# Patient Record
Sex: Female | Born: 1980 | Race: White | Hispanic: No | Marital: Married | Smoking: Never smoker
Health system: Southern US, Community
[De-identification: ages and names within clinical notes are randomized; demographics above are authoritative.]

## PROBLEM LIST (undated history)

## (undated) ENCOUNTER — Inpatient Hospital Stay (HOSPITAL_COMMUNITY): Payer: Self-pay

## (undated) DIAGNOSIS — O139 Gestational [pregnancy-induced] hypertension without significant proteinuria, unspecified trimester: Secondary | ICD-10-CM

## (undated) DIAGNOSIS — I8392 Asymptomatic varicose veins of left lower extremity: Secondary | ICD-10-CM

## (undated) HISTORY — PX: VARICOSE VEIN SURGERY: SHX832

## (undated) HISTORY — PX: NO PAST SURGERIES: SHX2092

## (undated) HISTORY — DX: Asymptomatic varicose veins of left lower extremity: I83.92

## (undated) HISTORY — PX: WISDOM TOOTH EXTRACTION: SHX21

---

## 2011-06-03 LAB — GC/CHLAMYDIA PROBE AMP, GENITAL

## 2011-06-03 LAB — ABO/RH: RH Type: POSITIVE

## 2011-11-24 ENCOUNTER — Other Ambulatory Visit: Payer: Self-pay | Admitting: Obstetrics and Gynecology

## 2011-11-24 DIAGNOSIS — N63 Unspecified lump in unspecified breast: Secondary | ICD-10-CM

## 2011-11-27 ENCOUNTER — Other Ambulatory Visit: Payer: BC Managed Care – PPO

## 2011-12-10 ENCOUNTER — Other Ambulatory Visit: Payer: BC Managed Care – PPO

## 2011-12-16 ENCOUNTER — Inpatient Hospital Stay (HOSPITAL_COMMUNITY): Payer: BC Managed Care – PPO

## 2011-12-16 ENCOUNTER — Encounter (HOSPITAL_COMMUNITY): Payer: Self-pay | Admitting: *Deleted

## 2011-12-16 ENCOUNTER — Inpatient Hospital Stay (HOSPITAL_COMMUNITY)
Admission: AD | Admit: 2011-12-16 | Discharge: 2011-12-16 | Disposition: A | Discharge status: Home / Self Care | Payer: BC Managed Care – PPO | Source: Ambulatory Visit | Attending: Obstetrics and Gynecology | Admitting: Obstetrics and Gynecology

## 2011-12-16 DIAGNOSIS — O139 Gestational [pregnancy-induced] hypertension without significant proteinuria, unspecified trimester: Secondary | ICD-10-CM | POA: Insufficient documentation

## 2011-12-16 LAB — DIFFERENTIAL
Basophils Absolute: 0 10*3/uL (ref 0.0–0.1)
Basophils Relative: 0 % (ref 0–1)
Monocytes Absolute: 0.9 10*3/uL (ref 0.1–1.0)
Neutro Abs: 8.5 10*3/uL — ABNORMAL HIGH (ref 1.7–7.7)
Neutrophils Relative %: 69 % (ref 43–77)

## 2011-12-16 LAB — CBC
HCT: 37.2 % (ref 36.0–46.0)
MCHC: 33.6 g/dL (ref 30.0–36.0)
Platelets: 213 10*3/uL (ref 150–400)
RDW: 14 % (ref 11.5–15.5)
WBC: 12.4 10*3/uL — ABNORMAL HIGH (ref 4.0–10.5)

## 2011-12-16 LAB — COMPREHENSIVE METABOLIC PANEL
ALT: 22 U/L (ref 0–35)
Albumin: 2.4 g/dL — ABNORMAL LOW (ref 3.5–5.2)
Calcium: 9.5 mg/dL (ref 8.4–10.5)
GFR calc Af Amer: >90 mL/min (ref >90)
Glucose, Bld: 97 mg/dL (ref 70–99)
Sodium: 136 mEq/L (ref 135–145)
Total Protein: 6.3 g/dL (ref 6.0–8.3)

## 2011-12-16 LAB — URINALYSIS, ROUTINE W REFLEX MICROSCOPIC
Leukocytes, UA: NEGATIVE
Nitrite: NEGATIVE
Specific Gravity, Urine: <1.005 — ABNORMAL LOW (ref 1.005–1.030)
pH: 6 (ref 5.0–8.0)

## 2011-12-16 LAB — URIC ACID: Uric Acid, Serum: 7.1 mg/dL — ABNORMAL HIGH (ref 2.4–7.0)

## 2011-12-16 MED ORDER — LABETALOL HCL 100 MG PO TABS
200.0000 mg | ORAL_TABLET | Freq: Once | ORAL | Status: AC
  Administered 2011-12-16: 200 mg via ORAL
  Filled 2011-12-16: qty 2

## 2011-12-16 MED ORDER — LABETALOL HCL 100 MG PO TABS: 100.0000 mg | ORAL_TABLET | Freq: Two times a day (BID) | ORAL | Status: DC

## 2011-12-16 NOTE — Progress Notes (Signed)
History   30 yo G1P0 EDC 1/8 presents from the office for BBP and serial bps per Dr. Stefano Gaul, denies headache, visual spots or blurring, no abd pain,with swelling to calves and ankles only, +FM, denies srom or vag bleeding  Chief Complaint  Patient presents with  . Hypertension     OB History    Grav Para Term Preterm Abortions TAB SAB Ect Mult Living   1               No past medical history on file.  No past surgical history on file.  No family history on file.  History  Substance Use Topics  . Smoking status: Not on file  . Smokeless tobacco: Not on file  . Alcohol Use: Not on file    Allergies: No Known Allergies  Prescriptions prior to admission  Medication Sig Dispense Refill  . dextromethorphan (DELSYM) 30 MG/5ML liquid Take 60 mg by mouth as needed. For cough       . guaiFENesin (MUCINEX) 600 MG 12 hr tablet Take 1,200 mg by mouth 2 (two) times daily as needed. For cough/congestion       . ondansetron (ZOFRAN-ODT) 8 MG disintegrating tablet Take 8 mg by mouth every 8 (eight) hours as needed. For nausea/vomiting       . Prenatal Vit-Fe Fumarate-FA (PRENATAL MULTIVITAMIN) TABS Take 1 tablet by mouth daily.         O: +1 edema to lower legs, DTR +2 bilaterally, no clonus  Blood pressure 140/82, pulse 87, temperature 97 F (36.1 C), temperature source Oral, resp. rate 20, height 5\' 6"  (1.676 m), weight 96.616 kg (213 lb), SpO2 99.00%.    ED Course  A HTN 37 1/7 week IUP P BPP, serial bps, labetalol 200 mg po now. Collaboration with Dr. Pennie Rushing per telephone. Lavera Guise, CNM

## 2011-12-16 NOTE — Progress Notes (Signed)
S:  Pt still w/o any PIH s/s.  GFM.  No discernable ctxs.  Husband supportive and at Bedside.  Does feel she has pushed herself "to the limit," the past few days in preparation for getting out for Christmas vacation and starting maternity leave.  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  Has 1/2 day only tomorrow w/ students (early release day).  O:  .Marland Kitchen Filed Vitals:   12/16/11 2105 12/16/11 2206 12/16/11 2208 12/16/11 2223  BP: 140/82 141/82 127/74 122/72  Pulse: 87 86 81 85  Temp:      TempSrc:      Resp:      Height:      Weight:      SpO2:      .Marland Kitchen Results for orders placed during the hospital encounter of 12/16/11 (from the past 24 hour(s))  CBC     Status: Abnormal   Collection Time   12/16/11  9:35 PM      Component Value Range   WBC 12.4 (*) 4.0 - 10.5 (K/uL)   RBC 4.23  3.87 - 5.11 (MIL/uL)   Hemoglobin 12.5  12.0 - 15.0 (g/dL)   HCT 16.1  09.6 - 04.5 (%)   MCV 87.9  78.0 - 100.0 (fL)   MCH 29.6  26.0 - 34.0 (pg)   MCHC 33.6  30.0 - 36.0 (g/dL)   RDW 40.9  81.1 - 91.4 (%)   Platelets 213  150 - 400 (K/uL)  DIFFERENTIAL     Status: Abnormal   Collection Time   12/16/11  9:35 PM      Component Value Range   Neutrophils Relative 69  43 - 77 (%)   Neutro Abs 8.5 (*) 1.7 - 7.7 (K/uL)   Lymphocytes Relative 22  12 - 46 (%)   Lymphs Abs 2.7  0.7 - 4.0 (K/uL)   Monocytes Relative 8  3 - 12 (%)   Monocytes Absolute 0.9  0.1 - 1.0 (K/uL)   Eosinophils Relative 1  0 - 5 (%)   Eosinophils Absolute 0.2  0.0 - 0.7 (K/uL)   Basophils Relative 0  0 - 1 (%)   Basophils Absolute 0.0  0.0 - 0.1 (K/uL)  URIC ACID     Status: Abnormal   Collection Time   12/16/11  9:35 PM      Component Value Range   Uric Acid, Serum 7.1 (*) 2.4 - 7.0 (mg/dL)  LACTATE DEHYDROGENASE     Status: Normal   Collection Time   12/16/11  9:35 PM      Component Value Range   LD 184  94 - 250 (U/L)  COMPREHENSIVE METABOLIC PANEL     Status: Abnormal   Collection Time   12/16/11  9:35 PM      Component Value Range   Sodium  136  135 - 145 (mEq/L)   Potassium 3.9  3.5 - 5.1 (mEq/L)   Chloride 102  96 - 112 (mEq/L)   CO2 23  19 - 32 (mEq/L)   Glucose, Bld 97  70 - 99 (mg/dL)   BUN 10  6 - 23 (mg/dL)   Creatinine, Ser 7.82  0.50 - 1.10 (mg/dL)   Calcium 9.5  8.4 - 95.6 (mg/dL)   Total Protein 6.3  6.0 - 8.3 (g/dL)   Albumin 2.4 (*) 3.5 - 5.2 (g/dL)   AST 23  0 - 37 (U/L)   ALT 22  0 - 35 (U/L)   Alkaline Phosphatase 116  39 - 117 (U/L)  Total Bilirubin 0.2 (*) 0.3 - 1.2 (mg/dL)   GFR calc non Af Amer >90  >90 (mL/min)   GFR calc Af Amer >90  >90 (mL/min)  URINALYSIS, ROUTINE W REFLEX MICROSCOPIC     Status: Abnormal   Collection Time   12/16/11 10:00 PM      Component Value Range   Color, Urine YELLOW  YELLOW    APPearance CLEAR  CLEAR    Specific Gravity, Urine <1.005 (*) 1.005 - 1.030    pH 6.0  5.0 - 8.0    Glucose, UA NEGATIVE  NEGATIVE (mg/dL)   Hgb urine dipstick NEGATIVE  NEGATIVE    Bilirubin Urine NEGATIVE  NEGATIVE    Ketones, ur NEGATIVE  NEGATIVE (mg/dL)   Protein, ur NEGATIVE  NEGATIVE (mg/dL)   Urobilinogen, UA 0.2  0.0 - 1.0 (mg/dL)   Nitrite NEGATIVE  NEGATIVE    Leukocytes, UA NEGATIVE  NEGATIVE    PE:  Gen:  NAD, A&Ox3          Abd:  Soft, NT gravid          Pelvic:  Deferred          Ext:  DTR's 2+ BLE:  Moderate generalized edema at present--has compression hose on. No clonus  EFM:  135, reactive, moderate variability, no decels TOCO:  Few ripples  U/S:  Vtx, 8/8 BPP. AFI:  Subjectively WNL  A:  1.  IUP at 37.1       2.  Gestational HTN       3.  Recent decreased FM, but had been on cold medicine, and once stopped taking, movement returned to nml       4.  Reactive NST & 8/8 BPP--total = 10/10 fetal testing today       5.  Work stressors       6.  Normal PIH labs P:  1.  Per c/w Dr. Pennie Rushing, may d/c home to begin 24 hr urine in the morning and drop off Friday at [facility] and get BP            checked.       2.  PIH precautions and FKC    3.  Pt desires to not go into work  tomorrow, so letter stating maternity leave starting 12/16/11 given.    4.  Level 2 BR    5.  Will add growth u/s w/ BPP to next week's appt (pt thinks Thursday 12/27)    6.  F/u prn    7.  Pt received 200mg  po Labetalol x1 about 1.5 hrs ago, but when reconsulted w/ Dr. Pennie Rushing, she rec'd Labetalol 100mg  po bid more appropriate since pt will be OOW and Level 2 BR; Called Rx into [facility] on [address] to be picked up tonight.  (of note--pt lives in [place]).  Rexene Edison, CNM 12/16/11 2313

## 2011-12-16 NOTE — Progress Notes (Signed)
Pt sent from the office -for evaluation of B/P and NST

## 2011-12-18 ENCOUNTER — Ambulatory Visit
Admission: RE | Admit: 2011-12-18 | Discharge: 2011-12-18 | Disposition: A | Discharge status: Home / Self Care | Payer: BC Managed Care – PPO | Source: Ambulatory Visit | Attending: Obstetrics and Gynecology | Admitting: Obstetrics and Gynecology

## 2011-12-18 DIAGNOSIS — N63 Unspecified lump in unspecified breast: Secondary | ICD-10-CM

## 2011-12-29 NOTE — L&D Delivery Note (Signed)
Delivery Note Anesthesia: Local  Episiotomy: None Lacerations: 2nd degree;Perineal;Sulcus bilateral shallow Suture Repair: 3.0 and 4-0 monocryl, good hemostasis Est. Blood Loss (mL): 300  Mom to postpartum.  Baby to with mother.  Baraa Tubbs 01/08/2012, 5:48 AM   Delivery Note G1P0 40 3/7 week, GHTN on labetaolo 100 mg BID since 37 weeks, PPROM, lite meconium, cervidil ripening. Pushed x 5 contractions. At 4:40 AM a viable female was delivered via Vaginal, Spontaneous Delivery (Presentation: Left Occiput Anterior).  Easy delivery of the head, nuchal arm and loose nuchal cord x 1 slipped, easy delivey of the shoulders, baby placed on pt abd, cord doubly clamped et cut by FOB, APGAR: 9, 9; weight 6 lb 11 oz (3033 g).   Placenta status: Intact, Spontaneous, Schultze.  Cord: 3 vessels to pathology    [facility], [facility] 01/08/2012, 5:48 AM

## 2012-01-07 ENCOUNTER — Inpatient Hospital Stay (HOSPITAL_COMMUNITY)
Admission: AD | Admit: 2012-01-07 | Discharge: 2012-01-10 | DRG: 372 | Disposition: A | Discharge status: Home / Self Care | Payer: BC Managed Care – PPO | Source: Ambulatory Visit | Attending: Obstetrics and Gynecology | Admitting: Obstetrics and Gynecology

## 2012-01-07 ENCOUNTER — Encounter (HOSPITAL_COMMUNITY): Payer: Self-pay

## 2012-01-07 DIAGNOSIS — O2200 Varicose veins of lower extremity in pregnancy, unspecified trimester: Secondary | ICD-10-CM | POA: Diagnosis present

## 2012-01-07 DIAGNOSIS — O4290 Premature rupture of membranes, unspecified as to length of time between rupture and onset of labor, unspecified weeks of gestation: Secondary | ICD-10-CM | POA: Diagnosis present

## 2012-01-07 DIAGNOSIS — O139 Gestational [pregnancy-induced] hypertension without significant proteinuria, unspecified trimester: Principal | ICD-10-CM

## 2012-01-07 DIAGNOSIS — B951 Streptococcus, group B, as the cause of diseases classified elsewhere: Secondary | ICD-10-CM

## 2012-01-07 DIAGNOSIS — O99892 Other specified diseases and conditions complicating childbirth: Secondary | ICD-10-CM | POA: Diagnosis present

## 2012-01-07 DIAGNOSIS — Z22330 Carrier of Group B streptococcus: Secondary | ICD-10-CM

## 2012-01-07 DIAGNOSIS — O99820 Streptococcus B carrier state complicating pregnancy: Secondary | ICD-10-CM

## 2012-01-07 HISTORY — DX: Gestational (pregnancy-induced) hypertension without significant proteinuria, unspecified trimester: O13.9

## 2012-01-07 LAB — COMPREHENSIVE METABOLIC PANEL
ALT: 14 U/L (ref 0–35)
AST: 20 U/L (ref 0–37)
Albumin: 2.6 g/dL — ABNORMAL LOW (ref 3.5–5.2)
Chloride: 101 mEq/L (ref 96–112)
Creatinine, Ser: 0.56 mg/dL (ref 0.50–1.10)
Potassium: 4.4 mEq/L (ref 3.5–5.1)
Sodium: 133 mEq/L — ABNORMAL LOW (ref 135–145)
Total Bilirubin: 0.2 mg/dL — ABNORMAL LOW (ref 0.3–1.2)

## 2012-01-07 LAB — CBC
MCH: 30.3 pg (ref 26.0–34.0)
MCHC: 34 g/dL (ref 30.0–36.0)
Platelets: 216 10*3/uL (ref 150–400)

## 2012-01-07 LAB — URIC ACID: Uric Acid, Serum: 7.2 mg/dL — ABNORMAL HIGH (ref 2.4–7.0)

## 2012-01-07 MED ORDER — OXYCODONE-ACETAMINOPHEN 5-325 MG PO TABS: 2.0000 | ORAL_TABLET | ORAL | Status: DC | PRN

## 2012-01-07 MED ORDER — LACTATED RINGERS IV SOLN
INTRAVENOUS | Status: DC
  Administered 2012-01-07: 18:00:00 via INTRAVENOUS

## 2012-01-07 MED ORDER — ZOLPIDEM TARTRATE 10 MG PO TABS
10.0000 mg | ORAL_TABLET | Freq: Every evening | ORAL | Status: DC | PRN
  Administered 2012-01-07: 10 mg via ORAL
  Filled 2012-01-07: qty 1

## 2012-01-07 MED ORDER — FLEET ENEMA 7-19 GM/118ML RE ENEM: 1.0000 | ENEMA | RECTAL | Status: DC | PRN

## 2012-01-07 MED ORDER — ACETAMINOPHEN 325 MG PO TABS: 650.0000 mg | ORAL_TABLET | ORAL | Status: DC | PRN

## 2012-01-07 MED ORDER — LACTATED RINGERS IV SOLN: 500.0000 mL | INTRAVENOUS | Status: DC | PRN

## 2012-01-07 MED ORDER — IBUPROFEN 600 MG PO TABS: 600.0000 mg | ORAL_TABLET | Freq: Four times a day (QID) | ORAL | Status: DC | PRN

## 2012-01-07 MED ORDER — PROMETHAZINE HCL 25 MG/ML IJ SOLN: 12.5000 mg | INTRAMUSCULAR | Status: DC | PRN

## 2012-01-07 MED ORDER — ONDANSETRON HCL 4 MG/2ML IJ SOLN: 4.0000 mg | Freq: Four times a day (QID) | INTRAMUSCULAR | Status: DC | PRN

## 2012-01-07 MED ORDER — TERBUTALINE SULFATE 1 MG/ML IJ SOLN: 0.2500 mg | Freq: Once | INTRAMUSCULAR | Status: AC | PRN

## 2012-01-07 MED ORDER — CITRIC ACID-SODIUM CITRATE 334-500 MG/5ML PO SOLN: 30.0000 mL | ORAL | Status: DC | PRN

## 2012-01-07 MED ORDER — PENICILLIN G POTASSIUM 5000000 UNITS IJ SOLR
2.5000 10*6.[IU] | INTRAVENOUS | Status: DC
  Administered 2012-01-07 – 2012-01-08 (×2): 2.5 10*6.[IU] via INTRAVENOUS
  Filled 2012-01-07 (×5): qty 2.5

## 2012-01-07 MED ORDER — BUTORPHANOL TARTRATE 2 MG/ML IJ SOLN: 1.0000 mg | INTRAMUSCULAR | Status: DC | PRN

## 2012-01-07 MED ORDER — DINOPROSTONE 10 MG VA INST
10.0000 mg | VAGINAL_INSERT | Freq: Once | VAGINAL | Status: AC
  Administered 2012-01-07: 10 mg via VAGINAL
  Filled 2012-01-07: qty 1

## 2012-01-07 MED ORDER — LABETALOL HCL 100 MG PO TABS
100.0000 mg | ORAL_TABLET | Freq: Two times a day (BID) | ORAL | Status: DC
Start: 2012-01-07 — End: 2012-01-10
  Administered 2012-01-07 – 2012-01-10 (×6): 100 mg via ORAL
  Filled 2012-01-07 (×8): qty 1

## 2012-01-07 MED ORDER — OXYTOCIN 20 UNITS IN LACTATED RINGERS INFUSION - SIMPLE: 1.0000 m[IU]/min | INTRAVENOUS | Status: DC

## 2012-01-07 MED ORDER — OXYTOCIN BOLUS FROM INFUSION
500.0000 mL | Freq: Once | INTRAVENOUS | Status: DC
  Filled 2012-01-07: qty 1000
  Filled 2012-01-07: qty 500

## 2012-01-07 MED ORDER — LIDOCAINE HCL (PF) 1 % IJ SOLN
30.0000 mL | INTRAMUSCULAR | Status: DC | PRN
  Administered 2012-01-08: 30 mL via SUBCUTANEOUS
  Filled 2012-01-07: qty 30

## 2012-01-07 MED ORDER — OXYTOCIN 20 UNITS IN LACTATED RINGERS INFUSION - SIMPLE
125.0000 mL/h | Freq: Once | INTRAVENOUS | Status: AC
  Administered 2012-01-08: 999 mL/h via INTRAVENOUS

## 2012-01-07 MED ORDER — PENICILLIN G POTASSIUM 5000000 UNITS IJ SOLR
5.0000 10*6.[IU] | Freq: Once | INTRAVENOUS | Status: AC
  Administered 2012-01-07: 5 10*6.[IU] via INTRAVENOUS
  Filled 2012-01-07: qty 5

## 2012-01-07 MED ORDER — BUTORPHANOL TARTRATE 2 MG/ML IJ SOLN
1.0000 mg | INTRAMUSCULAR | Status: DC | PRN
  Administered 2012-01-08: 1 mg via INTRAVENOUS
  Filled 2012-01-07: qty 1

## 2012-01-07 NOTE — H&P (Signed)
Diamond Dunn is a 31 y.o. female, G1P0, 40 2/7 weeks, presenting with SROM at 3:15pm, light MSF, occasional contractions.  Cervix was 1 cm, 75% earlier this week.  Reports +FM, denies bleeding.  No report of HA, visual symptoms, or epigastric pain.  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  EDC of 01/05/12 was established by 1st trimester [place] at 5 weeks.  Anatomy scan was done at 19  weeks, with normal findings and an anterior placenta.  Further ultrasounds were done at 39 6/7 weeks, with normal fluid and BPP 8/8.  At 37 weeks, she had elevated BPs and was evaluated with PIH labs and a 24 hour urine that were normal (24 hour urine 77).  She was started on Labetalol 100 mg po BID at that time, with stabilization of BP.  Further signficant events:  [facility] in [facility]--had several small cysts on [place].  Left leg varicosities have been stable, with patient wearing compression hose for comfort.   OB History    Grav Para Term Preterm Abortions TAB SAB Ect Mult Living   1 0 0 0 0 0 0 0 0 0      Past Medical History  Diagnosis Date  . Pregnancy induced hypertension     on labetalol since 37wks  Calf injury 2009, with vein surgery as outcome.  Past Surgical History  Procedure Date  . No past surgeries   Vein surgery 2011, wisdom teeth x 2 in college  Family History: family history is negative for Anesthesia problems, and Hypotension, and Malignant hyperthermia, and Pseudochol deficiency, . Mother--heart murmur.  Father, PGF, PU hypertension and heart disease.  MGF lung ca, MGM cervical cancer.  FOB's aunt and cousin osteoporosis.  FOB's father polycythemia.  FOB aplastic anemia, hx bone marrow transplant.  Social History:  reports that she has never smoked. She does not have any  smokeless tobacco history on file. She reports that she does not drink alcohol or use illicit drugs. Caucasian, Christian faith.  College educated, employed as Runner, broadcasting/film/video.  Married to FOB, Madelaine Bhat, who is college educated and employed in Airline pilot.  Followed by the CNM service.    Dilation: 1 Effacement (%): 60;70 Station: -1;-2 Exam by:: Manfred Arch CNM Leaking light MSF.  Blood pressure 135/88, pulse 96, temperature 98.8 F (37.1 C), temperature source Oral, resp. rate 20, height 5\' 6"  (1.676 m), weight 216 lb (97.977 kg).  Chest clear Heart RRR without murmur Abd gravid, NT Pelvic--see above Ext 1+ edema, DTR 1+ without clonus Left leg with varicosities.  Prenatal labs: ABO, Rh: O/Positive/-- (06/06 0000) Antibody:  Neg    Rubella:  Immune RPR: Nonreactive (06/06 0000)  HBsAg: Negative (06/06 0000)  HIV: Non-reactive (06/06 0000)  GBS: Positive (12/12 0000)  Declined genetic screening Hgb 14.8 at NOB/12.8 at 28 weeks Glucola 115 PIH labs at 37 weeks WNL, with 24 hour urine 77.  Assessment/Plan: IUP at 40 2/7 weeks SROM, minimal labor Positive GBS Gestational hypertension, on Labetalol Varicosities of lower extremities  Plan: Admit to Birthing Suite per consult with Dr. Stefano Gaul Routine CNM orders GBS prophylaxis with PCN Continue Labetalol 100 mg po BID Will observe at present--if no increase in labor by 9pm, will recommend cervical ripening/augmentation. Patient's has goal of non-medicated labor, if possible.  Nigel Bridgeman 01/07/2012, 5:14 PM

## 2012-01-07 NOTE — Progress Notes (Signed)
States close contractions, agrees to ripening or pitocin O 145/88      fhts 130s 120 LTV mod accels      uc q 2-4 mild to mod      Vag not assessed       Results for orders placed during the hospital encounter of 01/07/12 (from the past 24 hour(s))  ANTIBODY SCREEN     Status: Normal      Component Value Range   Antibody Screen Negative    CBC     Status: Normal   Collection Time   01/07/12  5:27 PM      Component Value Range   WBC 10.4  4.0 - 10.5 (K/uL)   RBC 4.36  3.87 - 5.11 (MIL/uL)   Hemoglobin 13.2  12.0 - 15.0 (g/dL)   HCT 62.9  52.8 - 41.3 (%)   MCV 89.0  78.0 - 100.0 (fL)   MCH 30.3  26.0 - 34.0 (pg)   MCHC 34.0  30.0 - 36.0 (g/dL)   RDW 24.4  01.0 - 27.2 (%)   Platelets 216  150 - 400 (K/uL)  COMPREHENSIVE METABOLIC PANEL     Status: Abnormal   Collection Time   01/07/12  6:13 PM      Component Value Range   Sodium 133 (*) 135 - 145 (mEq/L)   Potassium 4.4  3.5 - 5.1 (mEq/L)   Chloride 101  96 - 112 (mEq/L)   CO2 24  19 - 32 (mEq/L)   Glucose, Bld 82  70 - 99 (mg/dL)   BUN 10  6 - 23 (mg/dL)   Creatinine, Ser 5.36  0.50 - 1.10 (mg/dL)   Calcium 9.5  8.4 - 64.4 (mg/dL)   Total Protein 6.7  6.0 - 8.3 (g/dL)   Albumin 2.6 (*) 3.5 - 5.2 (g/dL)   AST 20  0 - 37 (U/L)   ALT 14  0 - 35 (U/L)   Alkaline Phosphatase 129 (*) 39 - 117 (U/L)   Total Bilirubin 0.2 (*) 0.3 - 1.2 (mg/dL)   GFR calc non Af Amer >90  >90 (mL/min)   GFR calc Af Amer >90  >90 (mL/min)  LACTATE DEHYDROGENASE     Status: Normal   Collection Time   01/07/12  6:13 PM      Component Value Range   LD 186  94 - 250 (U/L)  URIC ACID     Status: Abnormal   Collection Time   01/07/12  6:13 PM      Component Value Range   Uric Acid, Serum 7.2 (*) 2.4 - 7.0 (mg/dL)   A 40 2/7 week IUP     PPROM P reassess ~ 1 hour Lavera Guise, CNM

## 2012-01-07 NOTE — Progress Notes (Signed)
Agrees to cervidil after options for cervical ripening reviewed, risks and benefits O Fhts category 1     uc q 4-5      Vag 1 0 -1 VTX R thin meconium A PPROM    40 2/7 week IUP    GHTN P cervidil placed vag posterior fornix, encourage ambien or IV meds for rest, epidural if  Desires. Collaboration with Dr. Stefano Gaul. Lavera Guise, CNM

## 2012-01-07 NOTE — Progress Notes (Signed)
  Subjective: Now on [employer].  Aware of more contractions, but not uncomfortable.  Objective: BP 153/99  Pulse 84  Temp(Src) 97.9 F (36.6 C) (Oral)  Resp 18  Ht 5\' 6"  (1.676 m)  Wt 216 lb (97.977 kg)  BMI 34.86 kg/m2     Filed Vitals:   01/07/12 1629 01/07/12 1729 01/07/12 1732  BP: 135/88 153/99 153/99  Pulse: 96 84 84  Temp: 98.8 F (37.1 C) 97.9 F (36.6 C) 97.9 F (36.6 C)  TempSrc: Oral Oral Oral  Resp: 20 20 18   Height: 5\' 6"  (1.676 m) 5\' 6"  (1.676 m)   Weight: 216 lb (97.977 kg) 216 lb (97.977 kg)      FHT:  FHR: 140-150 bpm, variability: moderate,  accelerations:  Present,  decelerations:  Absent UC:   irregular, every 2-8 minutes  Labs: Lab Results  Component Value Date   WBC 10.4 01/07/2012   HGB 13.2 01/07/2012   HCT 38.8 01/07/2012   MCV 89.0 01/07/2012   PLT 216 01/07/2012    Assessment / Plan: SROM, very early labor Gestational hypertension--due Labetalol at 7pm PIH labs pending Will continue to observe at present  Maddon Horton 01/07/2012, 6:00 PM

## 2012-01-07 NOTE — Progress Notes (Signed)
Pt reports 2 gushes of brown tinged fluid since 1510 today. States has had some "faint" contractions since then. Denies vaginal bleeding. Reports positive fetal movement.

## 2012-01-08 ENCOUNTER — Other Ambulatory Visit: Payer: Self-pay | Admitting: Obstetrics and Gynecology

## 2012-01-08 ENCOUNTER — Encounter (HOSPITAL_COMMUNITY): Payer: Self-pay | Admitting: *Deleted

## 2012-01-08 DIAGNOSIS — B951 Streptococcus, group B, as the cause of diseases classified elsewhere: Secondary | ICD-10-CM

## 2012-01-08 DIAGNOSIS — O139 Gestational [pregnancy-induced] hypertension without significant proteinuria, unspecified trimester: Secondary | ICD-10-CM

## 2012-01-08 MED ORDER — DIBUCAINE 1 % RE OINT: 1.0000 "application " | TOPICAL_OINTMENT | RECTAL | Status: DC | PRN

## 2012-01-08 MED ORDER — EPHEDRINE 5 MG/ML INJ: 10.0000 mg | INTRAVENOUS | Status: DC | PRN

## 2012-01-08 MED ORDER — FENTANYL 2.5 MCG/ML BUPIVACAINE 1/10 % EPIDURAL INFUSION (WH - ANES): 14.0000 mL/h | INTRAMUSCULAR | Status: DC

## 2012-01-08 MED ORDER — PRENATAL MULTIVITAMIN CH: 1.0000 | ORAL_TABLET | Freq: Every day | ORAL | Status: DC

## 2012-01-08 MED ORDER — ONDANSETRON HCL 4 MG/2ML IJ SOLN: 4.0000 mg | INTRAMUSCULAR | Status: DC | PRN

## 2012-01-08 MED ORDER — LACTATED RINGERS IV SOLN: 500.0000 mL | Freq: Once | INTRAVENOUS | Status: DC

## 2012-01-08 MED ORDER — PHENYLEPHRINE 40 MCG/ML (10ML) SYRINGE FOR IV PUSH (FOR BLOOD PRESSURE SUPPORT): 80.0000 ug | PREFILLED_SYRINGE | INTRAVENOUS | Status: DC | PRN

## 2012-01-08 MED ORDER — ZOLPIDEM TARTRATE 5 MG PO TABS: 5.0000 mg | ORAL_TABLET | Freq: Every evening | ORAL | Status: DC | PRN

## 2012-01-08 MED ORDER — WITCH HAZEL-GLYCERIN EX PADS: 1.0000 "application " | MEDICATED_PAD | CUTANEOUS | Status: DC | PRN

## 2012-01-08 MED ORDER — IBUPROFEN 600 MG PO TABS
600.0000 mg | ORAL_TABLET | Freq: Four times a day (QID) | ORAL | Status: DC
  Administered 2012-01-08 – 2012-01-10 (×9): 600 mg via ORAL
  Filled 2012-01-08 (×9): qty 1

## 2012-01-08 MED ORDER — BENZOCAINE-MENTHOL 20-0.5 % EX AERO
1.0000 "application " | INHALATION_SPRAY | CUTANEOUS | Status: DC | PRN
  Administered 2012-01-10: 1 via TOPICAL

## 2012-01-08 MED ORDER — PRENATAL MULTIVITAMIN CH
1.0000 | ORAL_TABLET | Freq: Every day | ORAL | Status: DC
Start: 2012-01-08 — End: 2012-01-10
  Administered 2012-01-10: 1 via ORAL
  Filled 2012-01-08 (×2): qty 1

## 2012-01-08 MED ORDER — TETANUS-DIPHTH-ACELL PERTUSSIS 5-2.5-18.5 LF-MCG/0.5 IM SUSP
0.5000 mL | Freq: Once | INTRAMUSCULAR | Status: AC
  Administered 2012-01-09: 0.5 mL via INTRAMUSCULAR
  Filled 2012-01-08: qty 0.5

## 2012-01-08 MED ORDER — DIPHENHYDRAMINE HCL 50 MG/ML IJ SOLN: 12.5000 mg | INTRAMUSCULAR | Status: DC | PRN

## 2012-01-08 MED ORDER — OXYCODONE-ACETAMINOPHEN 5-325 MG PO TABS: 1.0000 | ORAL_TABLET | ORAL | Status: DC | PRN

## 2012-01-08 MED ORDER — SIMETHICONE 80 MG PO CHEW: 80.0000 mg | CHEWABLE_TABLET | ORAL | Status: DC | PRN

## 2012-01-08 MED ORDER — DIPHENHYDRAMINE HCL 25 MG PO CAPS: 25.0000 mg | ORAL_CAPSULE | Freq: Four times a day (QID) | ORAL | Status: DC | PRN

## 2012-01-08 MED ORDER — SENNOSIDES-DOCUSATE SODIUM 8.6-50 MG PO TABS
2.0000 | ORAL_TABLET | Freq: Every day | ORAL | Status: DC
  Administered 2012-01-08 – 2012-01-09 (×2): 2 via ORAL

## 2012-01-08 MED ORDER — BENZOCAINE-MENTHOL 20-0.5 % EX AERO
INHALATION_SPRAY | CUTANEOUS | Status: AC
  Administered 2012-01-08: 10:00:00
  Filled 2012-01-08: qty 56

## 2012-01-08 MED ORDER — ONDANSETRON HCL 4 MG PO TABS: 4.0000 mg | ORAL_TABLET | ORAL | Status: DC | PRN

## 2012-01-08 MED ORDER — LANOLIN HYDROUS EX OINT: TOPICAL_OINTMENT | CUTANEOUS | Status: DC | PRN

## 2012-01-08 NOTE — Progress Notes (Signed)
C/o of baby pushing O Fhts 130s categirt 1     uc q 2-3     abd soft between uc     Vag C +1       Filed Vitals:   01/08/12 0357  BP: 152/104  Pulse: 100  Temp:   Resp:   158/91 A 2nd stage P pushing now with urge Lavera Guise, CNM

## 2012-01-08 NOTE — Progress Notes (Signed)
C/o of back pain with contractions, nausea, so tired O Fhts 130s LTV mod     uc q 2-4 mod     abd soft, gravid, nt between uc     Vag 5 100 0 VTX R bloody show A active labor     ROM x 13 hours    GBS+     GHTN P discussed options for pain relief, positioning, on ball with back rub by Lorie Apley, CNM

## 2012-01-09 LAB — CBC
HCT: 31.1 % — ABNORMAL LOW (ref 36.0–46.0)
Hemoglobin: 10.4 g/dL — ABNORMAL LOW (ref 12.0–15.0)
RBC: 3.44 MIL/uL — ABNORMAL LOW (ref 3.87–5.11)
WBC: 12 10*3/uL — ABNORMAL HIGH (ref 4.0–10.5)

## 2012-01-09 NOTE — Progress Notes (Signed)
Post Partum Day 1 Subjective: Reports feeling well.  Denies weakness or dizziness.  Ambulating, voiding and tol po liquids and solids without difficulty.  Had BM earlier today.  Working on breastfeeding.Denies headaches, vision chgs, RUQ pain.    Objective: Blood pressure 134/85, pulse 85, temperature 97.3 F (36.3 C), temperature source Oral, resp. rate 18, height 5\' 6"  (1.676 m), weight 97.977 kg (216 lb), SpO2 97.00%, unknown if currently breastfeeding.  Physical Exam:  General: alert, cooperative and mild distress Heart:  RRR Lungs:  CTA bilat Breasts:  Soft Abd: Soft, NT, pos BS x 4 quads.   Lochia: appropriate, sm rubra Uterine Fundus: firm, NT @ umbilicus Incision: healing well.  Perineum intact DVT Evaluation: No evidence of DVT seen on physical exam. Negative Homan's sign bilat. No significant calf/ankle edema. DTRs 2+, no clonus.   Basename 01/09/12 0535 01/07/12 1727  HGB 10.4* 13.2  HCT 31.1* 38.8    Assessment/Plan: Stable s/p vaginal delivery  Plan discharge tomorrow. Continue current care.  LOS: 2 days   Bernardo Brayman O. 01/09/2012, 2:39 PM

## 2012-01-10 MED ORDER — BENZOCAINE-MENTHOL 20-0.5 % EX AERO
INHALATION_SPRAY | CUTANEOUS | Status: AC
  Filled 2012-01-10: qty 56

## 2012-01-10 MED ORDER — IBUPROFEN 600 MG PO TABS: 600.0000 mg | ORAL_TABLET | Freq: Four times a day (QID) | ORAL | Status: AC

## 2012-01-10 NOTE — Progress Notes (Addendum)
Patient ID: Diamond Dunn, female   DOB: September 09, 1981, 31 y.o.   MRN: [id number] Post Partum Day 2 Subjective: no complaints, up ad lib without syncope, voiding, tolerating PO, + flatus  Pain well controlled with po meds BF well Mood stable, bonding well Denies HA/N/V/RUQ pain or visual changes   Objective: Blood pressure 139/88, pulse 77, temperature 98.4 F (36.9 C), temperature source Oral, resp. rate 18, height 5\' 6"  (1.676 m), weight 97.977 kg (216 lb), SpO2 97.00%, unknown if currently breastfeeding.  Physical Exam:  General: alert and no distress Lungs: CTAB Heart: RRR Breasts: soft, nipples intact Lochia: appropriate Uterine Fundus: firm Perineum: WNL DVT Evaluation: No evidence of DVT seen on physical exam. Negative Homan's sign. No significant calf/ankle edema.   Basename 01/09/12 0535 01/07/12 1727  HGB 10.4* 13.2  HCT 31.1* 38.8    Assessment/Plan: Stable PP D/C home F/u smart start nurse BP check at home 2-3 days GHTN on labetalol - stable - no s/s pre-eclampsia Breastfeeding Plans NFP/condoms for contraception       LOS: 3 days   Diamond Dunn M 01/10/2012, 10:09 AM

## 2012-01-16 NOTE — Discharge Summary (Signed)
   Obstetric Discharge Summary Reason for Admission: rupture of membranes Prenatal Procedures: NST and ultrasound Intrapartum Procedures: spontaneous vaginal delivery and GBS prophylaxis Postpartum Procedures: none Complications-Operative and Postpartum: none    Hemoglobin  Date Value Range Status  01/09/2012 10.4* 12.0-15.0 (g/dL) Final     REPEATED TO VERIFY     DELTA CHECK NOTED     HCT  Date Value Range Status  01/09/2012 31.1* 36.0-46.0 (%) Final    Hospital Course:  Hospital Course: Admitted with PROM. Cervidil was placed.  pos GBS. Progressed to fully dilated, . Delivery was performed by M.Kresbach without difficulty. Patient and baby tolerated the procedure without difficulty, with a 2nd degree and bilateral sulcus laceration noted. Infant to FTN. Mother and infant then had an uncomplicated postpartum course, with breast feeding going well. Mom's physical exam was WNL, and she was discharged home in stable condition. Contraception plan was NFP/condoms.  She received adequate benefit from po pain medications.  Discharge Diagnoses: TERM preg - delivered, GHTN - stable  Discharge Information: Date: 01/16/2012 Activity: pelvic rest Diet: routine Medications:  Medication List  As of 01/16/2012  5:23 AM   START taking these medications         ibuprofen 600 MG tablet   Commonly known as: ADVIL,MOTRIN   Take 1 tablet (600 mg total) by mouth every 6 (six) hours.         CONTINUE taking these medications         clindamycin 1 % external solution   Commonly known as: CLEOCIN T      labetalol 100 MG tablet   Commonly known as: NORMODYNE   Take 1 tablet (100 mg total) by mouth 2 (two) times daily.      prenatal multivitamin Tabs          Where to get your medications    These are the prescriptions that you need to pick up.   You may get these medications from any pharmacy.         ibuprofen 600 MG tablet           Condition: stable Instructions: refer to  practice specific booklet Discharge to: home Follow-up Information    Follow up with French Kendra M, CNM in 1 week. (BP check, then at 6weeks for postpartum check up)    Contact information:   [address] [place] [address] [phone]          Newborn Data: Live born  Information for the patient's newborn:  Krisha, Beegle [[id number]]  female ; APGAR , 9, 9  ; weight ; 6-11 Home with mother.  Panfilo Ketchum M 01/16/2012, 5:23 AM

## 2012-11-07 ENCOUNTER — Telehealth: Payer: Self-pay | Admitting: Obstetrics and Gynecology

## 2012-11-29 ENCOUNTER — Encounter: Payer: BC Managed Care – PPO | Admitting: Obstetrics and Gynecology

## 2012-12-05 ENCOUNTER — Encounter: Payer: BC Managed Care – PPO | Admitting: Obstetrics and Gynecology

## 2012-12-06 ENCOUNTER — Ambulatory Visit (INDEPENDENT_AMBULATORY_CARE_PROVIDER_SITE_OTHER): Payer: BC Managed Care – PPO | Admitting: Obstetrics and Gynecology

## 2012-12-06 ENCOUNTER — Encounter: Payer: Self-pay | Admitting: Obstetrics and Gynecology

## 2012-12-06 VITALS — BP 110/72 | Wt 182.0 lb

## 2012-12-06 DIAGNOSIS — Z331 Pregnant state, incidental: Secondary | ICD-10-CM

## 2012-12-06 NOTE — Progress Notes (Signed)
Last Pap: 07/14/2010 "per pt 2012 w/ pregnancy I could not find any documentation of a 2012 pap" Pt requested Genetic Testing. FHT will be done by [facility] pt is <16 weeks Pt states she really wants to know how far along she is . Pt states INS will be covered in Jan 2014 Pt stated she can wait until Jan for U/S.    Pt states she had some cramping. But believes this is normal

## 2012-12-07 ENCOUNTER — Encounter: Payer: Self-pay | Admitting: Obstetrics and Gynecology

## 2012-12-07 NOTE — Progress Notes (Signed)
[facility] NEW OB EXAMINATION   Diamond Dunn is a 31 y.o. female, G2P1001, who presents at [redacted]w[redacted]d gestation for a new obstetrical examination. She is not completely sure about her last menstrual period. She had a vaginal delivery in January 2013. She wants to delay ultrasounds and perinatal testing until January 2014 because of insurance reasons.  She has a history of third trimester pregnancy-induced hypertension.  She has a history of rapid labors.  The following portions of the patient's history were reviewed and updated as appropriate: allergies, current medications, past family history, past medical history, past social history, past surgical history and problem list.  OB History    Grav Para Term Preterm Abortions TAB SAB Ect Mult Living   2 1 1  0 0 0 0 0 0 1      Past Medical History  Diagnosis Date  . Pregnancy induced hypertension     on labetalol since 37wks    Past Surgical History  Procedure Date  . No past surgeries     Family History  Problem Relation Age of Onset  . Anesthesia problems Neg Hx   . Hypotension Neg Hx   . Malignant hyperthermia Neg Hx   . Pseudochol deficiency Neg Hx     Social History:  reports that she has never smoked. She does not have any smokeless tobacco history on file. She reports that she does not drink alcohol or use illicit drugs.  Allergies: No Known Allergies  Medications: prenatal vitamins   Objective:    BP 110/72  Wt 182 lb (82.555 kg)  LMP 09/29/2012  Breastfeeding? Unknown    Weight:  Wt Readings from Last 1 Encounters:  12/06/12 182 lb (82.555 kg)          BMI: There is no height on file to calculate BMI.  General Appearance: Alert, appropriate appearance for age. No acute distress HEENT: Grossly normal Neck / Thyroid: Supple, no masses, nodes or enlargement Lungs: clear to auscultation bilaterally Back: No CVA tenderness Breast Exam: No masses or nodes.No dimpling, nipple retraction or discharge. Cardiovascular:  Regular rate and rhythm. S1, S2, no murmur Gastrointestinal: Soft, non-tender, no masses or organomegaly.                               Fundal height: not palpable                                Fetal heart tones audible: yes, 150 bpm  ++++++++++++++++++++++++++++++++++++++++++++++++++++++++  Pelvic Exam: External genitalia: normal general appearance Vaginal: normal without tenderness, induration or masses and relaxation: Yes Cervix: normal appearance Adnexa: normal bimanual exam Uterus: gravid, nontender, 10 weeks size  ++++++++++++++++++++++++++++++++++++++++++++++++++++++++  Lymphatic Exam: Non-palpable nodes in neck, clavicular, axillary, or inguinal regions Neurologic: Normal speech, no tremor  Psychiatric: Alert and oriented, appropriate affect.  Prenatal labs:   Declined until January 2014  Wet Prep:   Previously done:            no, declined until next visit                      Urine analysis:     Declined until next visit    Assessment:   31 y.o. female G2P1001 at [redacted]w[redacted]d gestation ( EDC is @EDC @) by: Normal Last menstrual period: no Ultrasound:  Declined until January 2014                               History of pregnancy-induced hypertension  History of rapid labors  Recent vaginal delivery in January 2013   Plan:    We discussed routine pregnancy issues:  Toxoplasmosis was reviewed.  The patient was told to avoid cat liter boxes and feces.  The patient was told to avoid predator fish including tuna because of our concerns for mercury consumption.  The patient was told to avoid soft cheeses.  The patient was told to be sure that all lunch meats are well cooked.  Genetic screening was discussed. declined  Our model for pregnancy management was reviewed.  Proper diet and exercise reviewed.  Return to office in 4 weeks.  Medications include:  Prenatal vitamins  Mylinda Latina.D.

## 2012-12-28 NOTE — L&D Delivery Note (Signed)
Delivery Note  Pt on hands/knees in tub, doing well, FHR remained reassuring, anterior cervix felt, was able to reduce cervix and pt pushed well, then turned to SF in the water, continued pushing well, FHR remained reassuring, vtx quickly started crowning.   At 11:41 AM a viable female was delivered via Vaginal, Spontaneous Delivery (Presentation: ; Occiput Anterior).  Shoulders delivered after infant rotated from LOA to ROA, infant brought up out of water to mom's abdomen, APGAR: 8, 9 ; weight 8 lb 14.2 oz (4031 g).   Placenta status: Intact, Spontaneous.  Trailing membranes, Cord: 3 vessels with the following complications: None.  Cord pH: n/a  Anesthesia: Local  Episiotomy: None Lacerations: 3rd degree Suture Repair: 3.0 vicryl vicryl rapide Est. Blood Loss (mL): 300  Dr Su Hilt called to [facility] to assist w repair.  Pt given IV fentanyl for pain mgmnt   Mom to postpartum.  Baby to nursery-stable. Mom and baby stable in recovery room,  Pt plans to BF Pt desires inpatient circumcision  Routine PP orders    LILLARD,SHELLEY M 07/09/2013, 5:46 PM  Called by SL, CNM to evaluate vaginal laceration after delivery.  Area examined and pt noted to have 3rd degree laceration. Rectal exam performed after double gloving and rectal mucosa was noted to be intact.  The sphincter muscle was repaired with 0 vicryl.  The overlying layers repaired with 2-0 and 0 vicryl.  Pt required at lease 40cc of 1% lidocaine over the course of the entire repair.  When i had reapproximated laceration to level of a 2nd degree, SL did remainder of repair.  Pt continued to have some bleeding and i was called back to the room.  Pt was having bleeding from apex of laceration which was made hemostatic with several stitches of 2-0 vicryl.  A rt periurethral lac was repaired with interrupted stitches of 3-0 vicryl.  Pt was also given of fentanyl for repair as well.  AYR

## 2013-01-02 ENCOUNTER — Other Ambulatory Visit: Payer: Self-pay

## 2013-01-02 DIAGNOSIS — O26849 Uterine size-date discrepancy, unspecified trimester: Secondary | ICD-10-CM

## 2013-01-03 ENCOUNTER — Encounter: Payer: BC Managed Care – PPO | Admitting: Obstetrics and Gynecology

## 2013-01-03 ENCOUNTER — Telehealth: Payer: Self-pay | Admitting: Obstetrics and Gynecology

## 2013-01-03 ENCOUNTER — Other Ambulatory Visit: Payer: BC Managed Care – PPO

## 2013-01-04 ENCOUNTER — Encounter: Payer: Self-pay | Admitting: Obstetrics and Gynecology

## 2013-01-04 ENCOUNTER — Ambulatory Visit (INDEPENDENT_AMBULATORY_CARE_PROVIDER_SITE_OTHER): Payer: BC Managed Care – PPO | Admitting: Obstetrics and Gynecology

## 2013-01-04 ENCOUNTER — Ambulatory Visit (INDEPENDENT_AMBULATORY_CARE_PROVIDER_SITE_OTHER): Payer: BC Managed Care – PPO

## 2013-01-04 VITALS — BP 124/78 | Wt 184.0 lb

## 2013-01-04 DIAGNOSIS — N760 Acute vaginitis: Secondary | ICD-10-CM

## 2013-01-04 DIAGNOSIS — O26849 Uterine size-date discrepancy, unspecified trimester: Secondary | ICD-10-CM

## 2013-01-04 DIAGNOSIS — Z331 Pregnant state, incidental: Secondary | ICD-10-CM

## 2013-01-04 DIAGNOSIS — A499 Bacterial infection, unspecified: Secondary | ICD-10-CM

## 2013-01-04 DIAGNOSIS — Z3686 Encounter for antenatal screening for cervical length: Secondary | ICD-10-CM

## 2013-01-04 DIAGNOSIS — Z23 Encounter for immunization: Secondary | ICD-10-CM

## 2013-01-04 LAB — POCT OSOM BVBLUE TEST: Bacterial Vaginosis: POSITIVE

## 2013-01-04 MED ORDER — METRONIDAZOLE 250 MG PO TABS: 250.0000 mg | ORAL_TABLET | Freq: Three times a day (TID) | ORAL | Status: AC

## 2013-01-04 NOTE — Progress Notes (Signed)
[redacted]w[redacted]d Pt wants flu vaccine

## 2013-01-04 NOTE — Progress Notes (Signed)
Ultrasound: 13 w 6 d IUP, normal ovaries, no fluid in CDS, normal adnexa. LMP EDD is consistent with Todays u/s Pt given flu vaccine

## 2013-01-04 NOTE — Progress Notes (Signed)
[redacted]w[redacted]d Normal ultrasound reviewed with confirmation of EDD Prenatal panel and OB urine culture ordered today OSOM BV done: +  Flagyl prescribed

## 2013-01-05 LAB — PRENATAL PANEL VII
Antibody Screen: NEGATIVE
Basophils Absolute: 0 10*3/uL (ref 0.0–0.1)
Basophils Relative: 0 % (ref 0–1)
Eosinophils Absolute: 0.1 10*3/uL (ref 0.0–0.7)
Eosinophils Relative: 1 % (ref 0–5)
HCT: 39.9 % (ref 36.0–46.0)
HIV: NONREACTIVE
Hemoglobin: 13.8 g/dL (ref 12.0–15.0)
Hepatitis B Surface Ag: NEGATIVE
Lymphocytes Relative: 16 % (ref 12–46)
Lymphs Abs: 1.4 10*3/uL (ref 0.7–4.0)
MCH: 29.8 pg (ref 26.0–34.0)
MCHC: 34.6 g/dL (ref 30.0–36.0)
MCV: 86.2 fL (ref 78.0–100.0)
Monocytes Absolute: 0.7 10*3/uL (ref 0.1–1.0)
Monocytes Relative: 8 % (ref 3–12)
Neutro Abs: 6.3 10*3/uL (ref 1.7–7.7)
Neutrophils Relative %: 75 % (ref 43–77)
Platelets: 245 10*3/uL (ref 150–400)
RBC: 4.63 MIL/uL (ref 3.87–5.11)
RDW: 13.5 % (ref 11.5–15.5)
Rh Type: POSITIVE
Rubella: 6.39 Index — ABNORMAL HIGH (ref <0.90)
WBC: 8.4 10*3/uL (ref 4.0–10.5)

## 2013-01-05 LAB — US OB COMP LESS 14 WKS

## 2013-01-06 LAB — CULTURE, OB URINE: Colony Count: 6000

## 2013-02-10 ENCOUNTER — Encounter: Payer: BC Managed Care – PPO | Admitting: Obstetrics and Gynecology

## 2013-02-10 ENCOUNTER — Other Ambulatory Visit: Payer: BC Managed Care – PPO

## 2013-02-20 ENCOUNTER — Ambulatory Visit: Payer: BC Managed Care – PPO

## 2013-02-20 ENCOUNTER — Ambulatory Visit: Payer: BC Managed Care – PPO | Admitting: Obstetrics and Gynecology

## 2013-02-20 VITALS — BP 120/76 | Wt 193.0 lb

## 2013-02-20 DIAGNOSIS — Z3686 Encounter for antenatal screening for cervical length: Secondary | ICD-10-CM

## 2013-02-20 DIAGNOSIS — Z3490 Encounter for supervision of normal pregnancy, unspecified, unspecified trimester: Secondary | ICD-10-CM | POA: Insufficient documentation

## 2013-02-20 NOTE — Progress Notes (Signed)
[redacted]w[redacted]d Pt c/o burning/tingling in left ankle x 92mo. Pt with h/o varicose vein surgery x 3-66yrs ago.  Neg Homan's Ultrasound shows:  SIUP  S=D     [place] EDD: 07/06/2013            AFI: not measured           Cervical length: not measured            Placenta localization: anterior           Fetal presentation: breech                    Anatomy survey is normal           Gender : female

## 2013-02-28 LAB — US OB COMP + 14 WK

## 2013-03-01 ENCOUNTER — Encounter: Payer: BC Managed Care – PPO | Admitting: Obstetrics and Gynecology

## 2013-03-01 ENCOUNTER — Other Ambulatory Visit: Payer: BC Managed Care – PPO

## 2013-07-09 ENCOUNTER — Inpatient Hospital Stay (HOSPITAL_COMMUNITY)
Admission: AD | Admit: 2013-07-09 | Discharge: 2013-07-11 | DRG: 372 | Disposition: A | Discharge status: Home / Self Care | Payer: BC Managed Care – PPO | Source: Ambulatory Visit | Attending: Obstetrics and Gynecology | Admitting: Obstetrics and Gynecology

## 2013-07-09 ENCOUNTER — Encounter (HOSPITAL_COMMUNITY): Payer: Self-pay | Admitting: *Deleted

## 2013-07-09 DIAGNOSIS — O98819 Other maternal infectious and parasitic diseases complicating pregnancy, unspecified trimester: Secondary | ICD-10-CM | POA: Diagnosis present

## 2013-07-09 DIAGNOSIS — Z22330 Carrier of Group B streptococcus: Secondary | ICD-10-CM

## 2013-07-09 DIAGNOSIS — O9903 Anemia complicating the puerperium: Secondary | ICD-10-CM | POA: Diagnosis not present

## 2013-07-09 DIAGNOSIS — D649 Anemia, unspecified: Secondary | ICD-10-CM | POA: Diagnosis not present

## 2013-07-09 DIAGNOSIS — Z3490 Encounter for supervision of normal pregnancy, unspecified, unspecified trimester: Secondary | ICD-10-CM

## 2013-07-09 DIAGNOSIS — IMO0001 Reserved for inherently not codable concepts without codable children: Secondary | ICD-10-CM

## 2013-07-09 DIAGNOSIS — O139 Gestational [pregnancy-induced] hypertension without significant proteinuria, unspecified trimester: Secondary | ICD-10-CM | POA: Diagnosis present

## 2013-07-09 DIAGNOSIS — O99892 Other specified diseases and conditions complicating childbirth: Secondary | ICD-10-CM | POA: Diagnosis present

## 2013-07-09 LAB — CBC
Hemoglobin: 14.5 g/dL (ref 12.0–15.0)
MCH: 30.5 pg (ref 26.0–34.0)
MCHC: 35 g/dL (ref 30.0–36.0)
MCV: 87.2 fL (ref 78.0–100.0)
RBC: 4.75 MIL/uL (ref 3.87–5.11)

## 2013-07-09 LAB — URIC ACID: Uric Acid, Serum: 7.3 mg/dL — ABNORMAL HIGH (ref 2.4–7.0)

## 2013-07-09 LAB — COMPREHENSIVE METABOLIC PANEL
ALT: 11 U/L (ref 0–35)
Alkaline Phosphatase: 137 U/L — ABNORMAL HIGH (ref 39–117)
BUN: 9 mg/dL (ref 6–23)
CO2: 23 mEq/L (ref 19–32)
Calcium: 9.5 mg/dL (ref 8.4–10.5)
GFR calc Af Amer: >90 mL/min (ref >90)
GFR calc non Af Amer: >90 mL/min (ref >90)
Glucose, Bld: 78 mg/dL (ref 70–99)
Potassium: 4.2 mEq/L (ref 3.5–5.1)
Sodium: 134 mEq/L — ABNORMAL LOW (ref 135–145)

## 2013-07-09 LAB — PROTEIN / CREATININE RATIO, URINE: Creatinine, Urine: 251.43 mg/dL

## 2013-07-09 LAB — URINALYSIS, ROUTINE W REFLEX MICROSCOPIC
Glucose, UA: NEGATIVE mg/dL
Ketones, ur: NEGATIVE mg/dL
Leukocytes, UA: NEGATIVE
Protein, ur: NEGATIVE mg/dL
Urobilinogen, UA: 0.2 mg/dL (ref 0.0–1.0)

## 2013-07-09 LAB — RPR: RPR Ser Ql: NONREACTIVE

## 2013-07-09 LAB — URINE MICROSCOPIC-ADD ON

## 2013-07-09 LAB — TYPE AND SCREEN: ABO/RH(D): O POS

## 2013-07-09 MED ORDER — SIMETHICONE 80 MG PO CHEW: 80.0000 mg | CHEWABLE_TABLET | ORAL | Status: DC | PRN

## 2013-07-09 MED ORDER — WITCH HAZEL-GLYCERIN EX PADS
1.0000 "application " | MEDICATED_PAD | CUTANEOUS | Status: DC | PRN
  Administered 2013-07-10: 1 via TOPICAL

## 2013-07-09 MED ORDER — PENICILLIN G POTASSIUM 5000000 UNITS IJ SOLR
5.0000 10*6.[IU] | Freq: Once | INTRAVENOUS | Status: AC
  Administered 2013-07-09: 5 10*6.[IU] via INTRAVENOUS
  Filled 2013-07-09: qty 5

## 2013-07-09 MED ORDER — MISOPROSTOL 200 MCG PO TABS: 800.0000 ug | ORAL_TABLET | Freq: Once | ORAL | Status: AC

## 2013-07-09 MED ORDER — FLEET ENEMA 7-19 GM/118ML RE ENEM: 1.0000 | ENEMA | Freq: Every day | RECTAL | Status: DC | PRN

## 2013-07-09 MED ORDER — MISOPROSTOL 200 MCG PO TABS
ORAL_TABLET | ORAL | Status: AC
  Administered 2013-07-09: 800 ug via VAGINAL
  Filled 2013-07-09: qty 4

## 2013-07-09 MED ORDER — PRENATAL MULTIVITAMIN CH: 1.0000 | ORAL_TABLET | Freq: Every day | ORAL | Status: DC

## 2013-07-09 MED ORDER — FENTANYL CITRATE 0.05 MG/ML IJ SOLN: 100.0000 ug | Freq: Once | INTRAMUSCULAR | Status: AC

## 2013-07-09 MED ORDER — ZOLPIDEM TARTRATE 5 MG PO TABS: 5.0000 mg | ORAL_TABLET | Freq: Every evening | ORAL | Status: DC | PRN

## 2013-07-09 MED ORDER — LACTATED RINGERS IV SOLN: 500.0000 mL | INTRAVENOUS | Status: DC | PRN

## 2013-07-09 MED ORDER — LIDOCAINE HCL (PF) 1 % IJ SOLN
30.0000 mL | INTRAMUSCULAR | Status: DC | PRN
  Filled 2013-07-09 (×4): qty 30

## 2013-07-09 MED ORDER — OXYTOCIN BOLUS FROM INFUSION
500.0000 mL | INTRAVENOUS | Status: DC
  Administered 2013-07-09: 500 mL via INTRAVENOUS

## 2013-07-09 MED ORDER — IBUPROFEN 600 MG PO TABS
600.0000 mg | ORAL_TABLET | Freq: Four times a day (QID) | ORAL | Status: DC
  Administered 2013-07-09 – 2013-07-11 (×7): 600 mg via ORAL
  Filled 2013-07-09 (×7): qty 1

## 2013-07-09 MED ORDER — LANOLIN HYDROUS EX OINT: TOPICAL_OINTMENT | CUTANEOUS | Status: DC | PRN

## 2013-07-09 MED ORDER — PRENATAL MULTIVITAMIN CH
1.0000 | ORAL_TABLET | Freq: Every day | ORAL | Status: DC
  Administered 2013-07-10 – 2013-07-11 (×2): 1 via ORAL
  Filled 2013-07-09 (×2): qty 1

## 2013-07-09 MED ORDER — OXYCODONE-ACETAMINOPHEN 5-325 MG PO TABS
1.0000 | ORAL_TABLET | ORAL | Status: DC | PRN
  Administered 2013-07-10 – 2013-07-11 (×4): 1 via ORAL
  Filled 2013-07-09 (×4): qty 1

## 2013-07-09 MED ORDER — OXYCODONE-ACETAMINOPHEN 5-325 MG PO TABS
1.0000 | ORAL_TABLET | ORAL | Status: DC | PRN
  Administered 2013-07-09: 1 via ORAL
  Filled 2013-07-09: qty 1

## 2013-07-09 MED ORDER — ACETAMINOPHEN 325 MG PO TABS: 650.0000 mg | ORAL_TABLET | ORAL | Status: DC | PRN

## 2013-07-09 MED ORDER — TETANUS-DIPHTH-ACELL PERTUSSIS 5-2.5-18.5 LF-MCG/0.5 IM SUSP
0.5000 mL | Freq: Once | INTRAMUSCULAR | Status: AC
  Administered 2013-07-10: 0.5 mL via INTRAMUSCULAR

## 2013-07-09 MED ORDER — ONDANSETRON HCL 4 MG PO TABS: 4.0000 mg | ORAL_TABLET | ORAL | Status: DC | PRN

## 2013-07-09 MED ORDER — SODIUM CHLORIDE 0.9 % IJ SOLN: 3.0000 mL | Freq: Two times a day (BID) | INTRAMUSCULAR | Status: DC

## 2013-07-09 MED ORDER — MEDROXYPROGESTERONE ACETATE 150 MG/ML IM SUSP: 150.0000 mg | INTRAMUSCULAR | Status: DC | PRN

## 2013-07-09 MED ORDER — LABETALOL HCL 5 MG/ML IV SOLN
10.0000 mg | INTRAVENOUS | Status: DC | PRN
  Filled 2013-07-09: qty 8

## 2013-07-09 MED ORDER — SENNOSIDES-DOCUSATE SODIUM 8.6-50 MG PO TABS
2.0000 | ORAL_TABLET | Freq: Every day | ORAL | Status: DC
  Administered 2013-07-09: 2 via ORAL

## 2013-07-09 MED ORDER — ONDANSETRON HCL 4 MG/2ML IJ SOLN: 4.0000 mg | INTRAMUSCULAR | Status: DC | PRN

## 2013-07-09 MED ORDER — OXYTOCIN 40 UNITS IN LACTATED RINGERS INFUSION - SIMPLE MED
62.5000 mL/h | INTRAVENOUS | Status: DC
  Administered 2013-07-09 (×3): 62.5 mL/h via INTRAVENOUS
  Filled 2013-07-09 (×2): qty 1000

## 2013-07-09 MED ORDER — MEASLES, MUMPS & RUBELLA VAC ~~LOC~~ INJ: 0.5000 mL | INJECTION | Freq: Once | SUBCUTANEOUS | Status: DC

## 2013-07-09 MED ORDER — SODIUM CHLORIDE 0.9 % IV SOLN: 250.0000 mL | INTRAVENOUS | Status: DC | PRN

## 2013-07-09 MED ORDER — FENTANYL CITRATE 0.05 MG/ML IJ SOLN
INTRAMUSCULAR | Status: AC
  Administered 2013-07-09: 100 ug via INTRAVENOUS
  Filled 2013-07-09: qty 2

## 2013-07-09 MED ORDER — SODIUM CHLORIDE 0.9 % IJ SOLN: 3.0000 mL | INTRAMUSCULAR | Status: DC | PRN

## 2013-07-09 MED ORDER — ONDANSETRON HCL 4 MG/2ML IJ SOLN: 4.0000 mg | Freq: Four times a day (QID) | INTRAMUSCULAR | Status: DC | PRN

## 2013-07-09 MED ORDER — CITRIC ACID-SODIUM CITRATE 334-500 MG/5ML PO SOLN: 30.0000 mL | ORAL | Status: DC | PRN

## 2013-07-09 MED ORDER — DIPHENHYDRAMINE HCL 25 MG PO CAPS: 25.0000 mg | ORAL_CAPSULE | Freq: Four times a day (QID) | ORAL | Status: DC | PRN

## 2013-07-09 MED ORDER — NALBUPHINE SYRINGE 5 MG/0.5 ML
5.0000 mg | INJECTION | INTRAMUSCULAR | Status: DC | PRN
  Filled 2013-07-09: qty 0.5

## 2013-07-09 MED ORDER — FLEET ENEMA 7-19 GM/118ML RE ENEM: 1.0000 | ENEMA | RECTAL | Status: DC | PRN

## 2013-07-09 MED ORDER — BISACODYL 10 MG RE SUPP: 10.0000 mg | Freq: Every day | RECTAL | Status: DC | PRN

## 2013-07-09 MED ORDER — BENZOCAINE-MENTHOL 20-0.5 % EX AERO
1.0000 "application " | INHALATION_SPRAY | CUTANEOUS | Status: DC | PRN
  Filled 2013-07-09: qty 56

## 2013-07-09 MED ORDER — DIBUCAINE 1 % RE OINT
1.0000 "application " | TOPICAL_OINTMENT | RECTAL | Status: DC | PRN
  Administered 2013-07-10: 1 via RECTAL
  Filled 2013-07-09: qty 28

## 2013-07-09 MED ORDER — PENICILLIN G POTASSIUM 5000000 UNITS IJ SOLR
2.5000 10*6.[IU] | INTRAMUSCULAR | Status: DC
  Administered 2013-07-09: 2.5 10*6.[IU] via INTRAVENOUS
  Filled 2013-07-09 (×5): qty 2.5

## 2013-07-09 MED ORDER — IBUPROFEN 600 MG PO TABS
600.0000 mg | ORAL_TABLET | Freq: Four times a day (QID) | ORAL | Status: DC | PRN
  Administered 2013-07-09: 600 mg via ORAL
  Filled 2013-07-09: qty 1

## 2013-07-09 NOTE — Progress Notes (Signed)
Pt up to bathroom  Assisted with short shower. Flo Shanks RN and myself assisting

## 2013-07-09 NOTE — Progress Notes (Signed)
Discussed bp's with S Lillard CNM.  Continue to watch BP's.  May transfer pt

## 2013-07-09 NOTE — MAU Note (Signed)
Contraction 3-5 mins apart.

## 2013-07-09 NOTE — Progress Notes (Signed)
Arms bent pt breastfeeding infant

## 2013-07-09 NOTE — H&P (Signed)
Diamond Dunn is a 32 y.o. female presenting at 71w 3d for regular painful uterine contractions.  Pt plans waterbirth and is accompanied by her husband and doula.   Maternal Medical History:  Reason for admission: Contractions.   Contractions: Onset was 3-5 hours ago.   Frequency: regular.   Perceived severity is strong.    Fetal activity: Perceived fetal activity is normal.   Last perceived fetal movement was within the past hour.    Prenatal complications: PIH.   Prenatal Complications - Diabetes: none.   Hx Present Pregnancy: Pt entered care at [redacted]wks gestation.  EDC was confirmed by ultrasound at [redacted]wks gestation.  Pt declined genetic testing.  [place] for anatomy at 18wks without anatomic abnormalities identified.  Pt with normal 1hr GTT.  Elevated BP noted at 39.0wks.  Preeclampsia labs/protein creatinine ratio at that time neg for preeclampsia.  Fetal surveillance with weekly NST started and NSTs have been reactive.    OB History   Grav Para Term Preterm Abortions TAB SAB Ect Mult Living   2 1 1  0 0 0 0 0 0 1     Past Medical History  Diagnosis Date  . Pregnancy induced hypertension 2012-2013 pregnancy    on labetalol since 37wks   Past Surgical History  Procedure Laterality Date  . No past surgeries     Family History: family history is negative for Anesthesia problems, and Hypotension, and Malignant hyperthermia, and Pseudochol deficiency, . Social History:  reports that she has never smoked. She does not have any smokeless tobacco history on file. She reports that she does not drink alcohol or use illicit drugs.   Prenatal Transfer Tool  Maternal Diabetes: No Genetic Screening: Declined Maternal Ultrasounds/Referrals: Normal Fetal Ultrasounds or other Referrals:  None Maternal Substance Abuse:  No Significant Maternal Medications:  None Significant Maternal Lab Results:  None Other Comments:  None  Review of Systems  Constitutional: Negative.   HENT:       Mild  headache this AM and thinks due to sleep deprivation  Eyes: Negative.   Respiratory: Negative.   Cardiovascular: Negative.   Gastrointestinal: Negative.   Genitourinary: Negative.   Musculoskeletal: Negative.   Skin: Negative.   Neurological: Positive for headaches.  Endo/Heme/Allergies: Negative.   Psychiatric/Behavioral: Negative.    Dilation: 3 Effacement (%): 90 Station: -2 Exam by:: Elsie Ra CNM Blood pressure 134/97, pulse 105, temperature 97.3 F (36.3 C), temperature source Oral, resp. rate 18, height 5\' 6"  (1.676 m), weight 100.245 kg (221 lb), last menstrual period 09/29/2012. Maternal Exam:  Uterine Assessment: Contraction strength is mild.  Contraction frequency is regular.   Abdomen: Patient reports no abdominal tenderness. Fundal height is 40.   Estimated fetal weight is 8#.   Fetal presentation: vertex  Introitus: Normal vulva. Normal vagina.  Ferning test: not done.  Nitrazine test: not done.  Pelvis: adequate for delivery.   Cervix: Cervix evaluated by digital exam.     Fetal Exam Fetal Monitor Review: Mode: ultrasound.   Baseline rate: 145.  Variability: moderate (6-25 bpm).   Pattern: accelerations present and no decelerations.    Fetal State Assessment: Category I - tracings are normal.     Physical Exam  Blood pressure 145/96, pulse 115, temperature 97.3 F (36.3 C), temperature source Oral, resp. rate 18, last menstrual period 09/29/2012.  Filed Vitals:    07/09/13 0630  07/09/13 0633  07/09/13 0644   BP:  160/96  158/111  145/96   Pulse:  118   115  Temp:  97.3 F (36.3 C)     TempSrc:  Oral     Resp:  18      Physical Exam  Constitutional: She is oriented to person, place, and time. She appears well-developed and well-nourished.  HENT:  Head: Normocephalic and atraumatic.  Right Ear: External ear normal.  Left Ear: External ear normal.  Nose: Nose normal.  Eyes: Conjunctivae are normal. Pupils are equal, round, and reactive to  light.  Neck: Normal range of motion. Neck supple.  Cardiovascular: Normal rate, regular rhythm and intact distal pulses.  Respiratory: Effort normal and breath sounds normal.  GI: Soft. Bowel sounds are normal.  Genitourinary:  Speculum exam deferred. Ext genitalia WNL. SVE 3cm/90%/-2/midposition/soft.  Musculoskeletal: Normal range of motion. She exhibits edema.  Tr edema in feet and legs bilaterally.  Neurological: She is alert and oriented to person, place, and time. She has normal reflexes.  Neg clonus  Skin: Skin is warm and dry.  Psychiatric: She has a normal mood and affect. Her behavior is normal.   Prenatal labs: ABO, Rh: O/POS/-- (01/08 1709) Antibody: NEG (01/08 1709) Rubella: 6.39 (01/08 1709) RPR: NON REAC (01/08 1709)  HBsAg: NEGATIVE (01/08 1709)  HIV: NON REACTIVE (01/08 1709)  GBS:   Positive  Assessment/Plan: IUP at 40w 3d Gestational hypertension-R/O preeclampsia Early labor Positve GBS  Admit to birthing suites per consult with Dr. Su Hilt Preeclampsia labs ordered on admission Labetalol IV ordered prn systolic greater than 155 or diastolic greater than 100. PCN G ordered for GBS prophylaxis.  Per Dr. Su Hilt, OK to proceed with plan for waterbirth at the present time.  Adir Schicker O. 07/09/2013, 7:49 AM

## 2013-07-09 NOTE — MAU Provider Note (Signed)
History    CSN: [id number]  Arrival date and time: 07/09/13 7829   First Provider Initiated Contact with Patient 07/09/13 0636      Chief Complaint  Patient presents with  . Labor Eval   HPI Pt presents at 40w 3d with c/o of regular uterine contractions which have increased in frequency and intensity.  Denies ROM or bldg.  Active fetus.  Pt is GBS positive and concerned about making sure she gets antibiotics prior to delivery as well as she plans a waterbirth.  She reports a mild headache but thinks due to sleep deprivation.  She denies vision chgs, RUQ pain.  She has edema of her feet and legs.  She has had some intermittently elevated blood pressures in the past few weeks and has been getting NSTs.    OB History   Grav Para Term Preterm Abortions TAB SAB Ect Mult Living   2 1 1  0 0 0 0 0 0 1      Past Medical History  Diagnosis Date  . Pregnancy induced hypertension     on labetalol since 37wks    Past Surgical History  Procedure Laterality Date  . No past surgeries      Family History  Problem Relation Age of Onset  . Anesthesia problems Neg Hx   . Hypotension Neg Hx   . Malignant hyperthermia Neg Hx   . Pseudochol deficiency Neg Hx     History  Substance Use Topics  . Smoking status: Never Smoker   . Smokeless tobacco: Not on file  . Alcohol Use: No    Allergies: No Known Allergies  Prescriptions prior to admission  Medication Sig Dispense Refill  . clindamycin (CLEOCIN T) 1 % external solution Apply 1 application topically at bedtime as needed. For acne      . labetalol (NORMODYNE) 100 MG tablet Take 1 tablet (100 mg total) by mouth 2 (two) times daily.  30 tablet  2  . Prenatal Vit-Fe Fumarate-FA (PRENATAL MULTIVITAMIN) TABS Take 1 tablet by mouth at bedtime.         Review of Systems  Constitutional: Negative.   Respiratory: Negative.   Cardiovascular: Negative.   Gastrointestinal: Negative.   Genitourinary: Negative.   Musculoskeletal: Negative.    Skin: Negative.   Neurological: Positive for headaches.  Endo/Heme/Allergies: Negative.   Psychiatric/Behavioral: Negative.    Physical Exam   Blood pressure 145/96, pulse 115, temperature 97.3 F (36.3 C), temperature source Oral, resp. rate 18, last menstrual period 09/29/2012. Filed Vitals:   07/09/13 0630 07/09/13 0633 07/09/13 0644  BP: 160/96 158/111 145/96  Pulse: 118  115  Temp: 97.3 F (36.3 C)    TempSrc: Oral    Resp: 18     Physical Exam  Constitutional: She is oriented to person, place, and time. She appears well-developed and well-nourished.  HENT:  Head: Normocephalic and atraumatic.  Right Ear: External ear normal.  Left Ear: External ear normal.  Nose: Nose normal.  Eyes: Conjunctivae are normal. Pupils are equal, round, and reactive to light.  Neck: Normal range of motion. Neck supple.  Cardiovascular: Normal rate, regular rhythm and intact distal pulses.   Respiratory: Effort normal and breath sounds normal.  GI: Soft. Bowel sounds are normal.  Genitourinary:  Speculum exam deferred.  Ext genitalia WNL.  SVE 3cm/90%/-2/midposition/soft.    Musculoskeletal: Normal range of motion. She exhibits edema.  Tr edema in feet and legs bilaterally.  Neurological: She is alert and oriented to person, place, and  time. She has normal reflexes.  Neg clonus  Skin: Skin is warm and dry.  Psychiatric: She has a normal mood and affect. Her behavior is normal.   FHR 145 bpm baseline; variability-moderate; accels-present; decels-absent.  FHR Cat 1. Tracing maternal HR intermittently due to maternal movement and position Toco: Contractions every 3-4.5 mins, mild to palpation.    MAU Course  Procedures   Assessment and Plan  IUP at 40w 3d Gestational hypertension with elevated blood pressures Positive GBS  Admit to [facility].   Check PIH labs on admission.   Dr. Su Hilt notified of admission.  Brenn Gatton O. 07/09/2013, 6:55 AM

## 2013-07-09 NOTE — Progress Notes (Signed)
Nursery notified pt requested medication be delayed

## 2013-07-09 NOTE — Progress Notes (Signed)
Pt standing leaning over bed.Marland Kitchen

## 2013-07-09 NOTE — Progress Notes (Signed)
doula's name Bernette Redbird

## 2013-07-09 NOTE — Progress Notes (Signed)
BP not taken per midwife

## 2013-07-09 NOTE — Progress Notes (Signed)
Patient ID: Diamond Dunn, female   DOB: 10-Feb-1981, 32 y.o.   MRN: [id number] Diamond Dunn is a 32 y.o. G2P1001 at [redacted]w[redacted]d admitted for labor   Subjective: Doing well in tub, feels more relaxed  Objective: BP 131/85  Pulse 113  Temp(Src) 97.4 F (36.3 C) (Oral)  Resp 16  Ht 5\' 6"  (1.676 m)  Wt 221 lb (100.245 kg)  BMI 35.69 kg/m2  LMP 09/29/2012     FHT:  FHR: 130 bpm, variability: moderate,  accelerations:  Present,  decelerations:  Absent UC:   regular, every 2-4  minutes SVE:   Dilation: 3 Effacement (%): 90 Station: -2 Exam by:: Diamond Dunn CNM  Vag exam deferred per pt request   Assessment / Plan: Spontaneous labor, progressing normally  Labor: Progressing normally and consider AROM w next exam  Preeclampsia:  labs stable and BP improved, prot/creat ratio =.09 Fetal Wellbeing:  Category I Pain Control:  Labor support without medications and birth tub Anticipated MOD:  NSVD  Recheck 1hr or at pt request   Update physician PRN   Diamond Dunn 07/09/2013, 9:44 AM

## 2013-07-09 NOTE — Progress Notes (Signed)
Dr Su Hilt here to repair

## 2013-07-09 NOTE — Progress Notes (Signed)
Pt refuses for hat to be placed on infant

## 2013-07-09 NOTE — Progress Notes (Signed)
Pt up to bathroom.

## 2013-07-10 LAB — CBC
Hemoglobin: 8.8 g/dL — ABNORMAL LOW (ref 12.0–15.0)
MCH: 30.1 pg (ref 26.0–34.0)
MCHC: 34.4 g/dL (ref 30.0–36.0)
Platelets: 137 10*3/uL — ABNORMAL LOW (ref 150–400)
RDW: 14.9 % (ref 11.5–15.5)

## 2013-07-10 MED ORDER — DOCUSATE SODIUM 100 MG PO CAPS
100.0000 mg | ORAL_CAPSULE | Freq: Two times a day (BID) | ORAL | Status: DC
  Administered 2013-07-10 – 2013-07-11 (×3): 100 mg via ORAL
  Filled 2013-07-10 (×3): qty 1

## 2013-07-10 MED ORDER — POLYSACCHARIDE IRON COMPLEX 150 MG PO CAPS
150.0000 mg | ORAL_CAPSULE | Freq: Two times a day (BID) | ORAL | Status: DC
  Filled 2013-07-10: qty 1

## 2013-07-10 MED ORDER — POLYSACCHARIDE IRON COMPLEX 150 MG PO CAPS
150.0000 mg | ORAL_CAPSULE | Freq: Every day | ORAL | Status: DC
  Administered 2013-07-10 – 2013-07-11 (×2): 150 mg via ORAL
  Filled 2013-07-10: qty 1

## 2013-07-10 NOTE — Progress Notes (Signed)
Post Partum Day 1: S/P SVD with a 3rd degree lac  Subjective: Patient up ad lib, denies syncope, dizziness, HA, SOB, tachycardia.  Has already had a BM without difficulty.   C/o pain with cramping particular with BF.  Denies pain with laceration or at rectum. Pt voiced concern of taking iron d/t history of constipation at beginning of pregnancy.  Discussed options and importance of increasing her hgb.  Pt voiced understanding and agreed to Niferex QD and Colace BID. Feeding:  Breastfeeding Contraceptive plan:   Unknown at this time  Objective: Blood pressure 122/86, pulse 86, temperature 98 F (36.7 C), temperature source Oral, resp. rate 18, height 5\' 6"  (1.676 m), weight 221 lb (100.245 kg), last menstrual period 09/29/2012, SpO2 96.00%, unknown if currently breastfeeding.  Physical Exam:  General: alert, cooperative and no distress Lochia: appropriate Uterine Fundus: firm Incision: healing well, bruising noted around perineum and rectum.  No tenderness to palpation. No hematoma noted. DVT Evaluation: No evidence of DVT seen on physical exam. Negative Homan's sign.   Recent Labs  07/09/13 0825 07/10/13 0500  HGB 14.5 8.8*  HCT 41.4 25.6*   Orthostatic Vital Signs Filed Vitals:   07/10/13 0605 07/10/13 0758 07/10/13 0759 07/10/13 0800  BP: 116/75 122/80 128/86 122/86  Pulse: 98 90 83 86  Temp: 98.5 F (36.9 C) 98 F (36.7 C)    TempSrc: Oral Oral    Resp: 18 20 20 18   Height:      Weight:      SpO2:        Assessment/Plan: S/P Vaginal delivery day 1 Anemic No orthostatic hypotension   Niferex QD - order modified Colace BID - Colace rx changed per pt request and d/t pr already having a BM. Continue current care Plan for discharge tomorrow   LOS: 1 day   Diamond Dunn 07/10/2013, 9:12 AM

## 2013-07-11 MED ORDER — OXYCODONE-ACETAMINOPHEN 5-325 MG PO TABS: 1.0000 | ORAL_TABLET | ORAL | Status: DC | PRN

## 2013-07-11 MED ORDER — POLYSACCHARIDE IRON COMPLEX 150 MG PO CAPS: 150.0000 mg | ORAL_CAPSULE | Freq: Every day | ORAL | Status: DC

## 2013-07-11 MED ORDER — IBUPROFEN 600 MG PO TABS: 600.0000 mg | ORAL_TABLET | Freq: Four times a day (QID) | ORAL | Status: DC

## 2013-07-11 MED ORDER — DSS 100 MG PO CAPS: 100.0000 mg | ORAL_CAPSULE | Freq: Two times a day (BID) | ORAL | Status: DC

## 2013-07-11 NOTE — Discharge Summary (Signed)
  Vaginal Delivery Discharge Summary  Diamond Dunn  DOB:    01/18/1981 MRN:    [id number] CSN:    [id number]  Date of admission:                  07/09/13  Date of discharge:                   07/11/13  Procedures this admission: SVD with 3rd degree lac  Date of Delivery: 07/09/13 by Sanda Klein  Newborn Data:  Live born female  Birth Weight: 8 lb 14.2 oz (4031 g) APGAR: 8,   Home with mother. Name: "Elija" Circumcision Plan: Inpatient  History of Present Illness:  Ms. Diamond Dunn is a 32 y.o. female, G2P2002, who presents at [redacted]w[redacted]d weeks gestation. The patient has been followed at the [facility]cology division of [facility]. She was admitted onset of labor. Her pregnancy has been complicated by:   Patient Active Problem List   Diagnosis Date Noted  . NSVD (normal spontaneous vaginal delivery) 07/10/2013  . Third degree perineal laceration 07/10/2013  . Gestational hypertension 01/08/2012  . Group B streptococcal infection in pregnancy 01/08/2012   Hospital course:  The patient was admitted for labor.   Her labor was not complicated. She proceeded to have a vaginal delivery of a healthy infant. Her delivery was not complicated, however had a 3rd degree laceration. Her postpartum course was not complicated, hgb 8.8; bruising noted on perineum and around rectum with out pain.  She was discharged to home on postpartum day 2 doing well.  Feeding:  breast  Contraception:  condoms  Discharge hemoglobin:  Hemoglobin  Date Value Range Status  07/10/2013 8.8* 12.0 - 15.0 g/dL Final     DELTA CHECK NOTED     REPEATED TO VERIFY     HCT  Date Value Range Status  07/10/2013 25.6* 36.0 - 46.0 % Final    Discharge Physical Exam:   General: alert, cooperative and no distress Lochia: appropriate Uterine Fundus: firm Incision: healing well DVT Evaluation: No evidence of DVT seen on physical exam. Negative Homan's  sign.  Intrapartum Procedures: spontaneous vaginal delivery and GBS prophylaxis Postpartum Procedures: none Complications-Operative and Postpartum: 3rd degree perineal laceration  Discharge Diagnoses: Term Pregnancy-delivered  Discharge Information:  Activity:           pelvic rest Diet:                routine Medications: PNV, Ibuprofen, Colace, Iron and Percocet Condition:      stable Instructions:  refer to practice specific booklet Discharge to: home  Follow-up Information   Follow up with [facility]. Schedule an appointment as soon as possible for a visit in 5 weeks. (Call with any questions or concerns)    Contact information:   [address] [place] [address] [phone]       Haroldine Laws 07/11/2013

## 2013-09-08 IMAGING — US US FETAL BPP W/O NONSTRESS
1 series · 14 of 19 positions shown · non-contrast
Comparison: none

[Series 1: us fetal bpp w/o nonstress · non-contrast · 19 acquisitions, 14 frames shown]
[im 1/19]
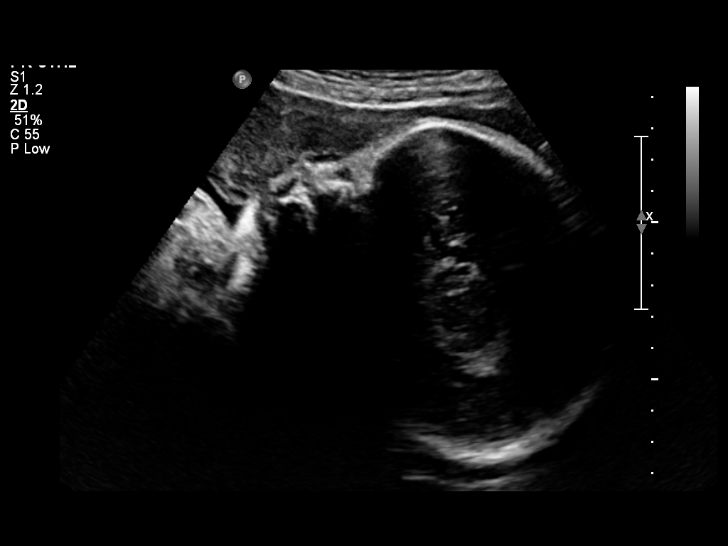
[im 3/19]
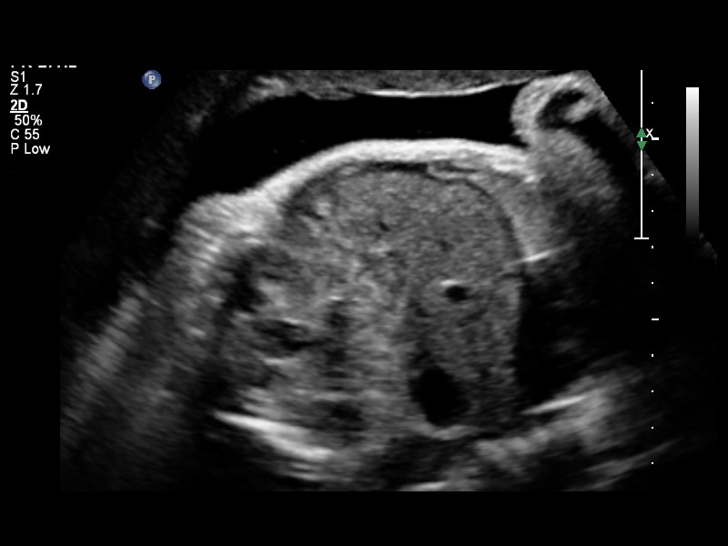
[im 4/19]
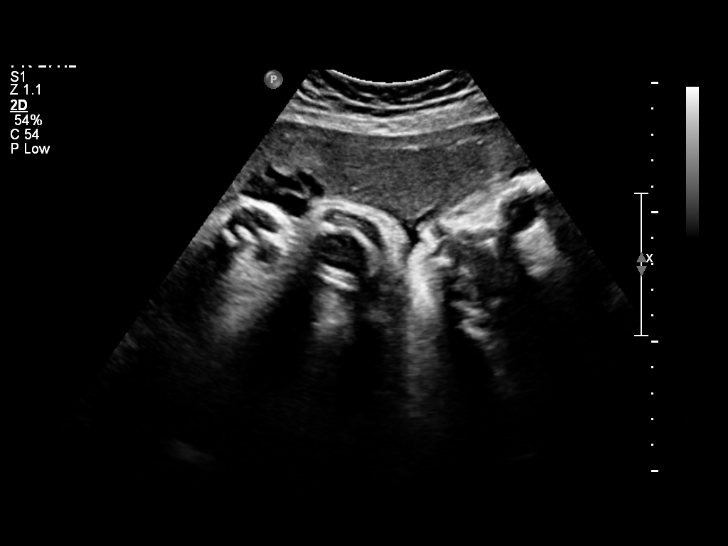
[im 5/19]
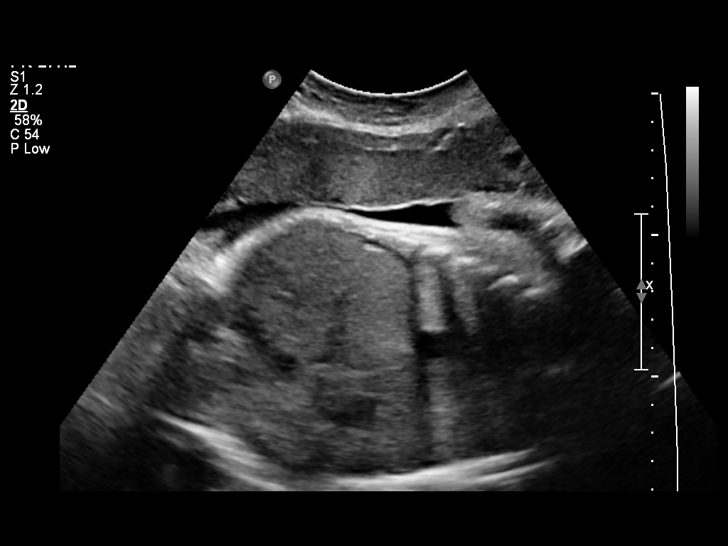
[im 7/19]
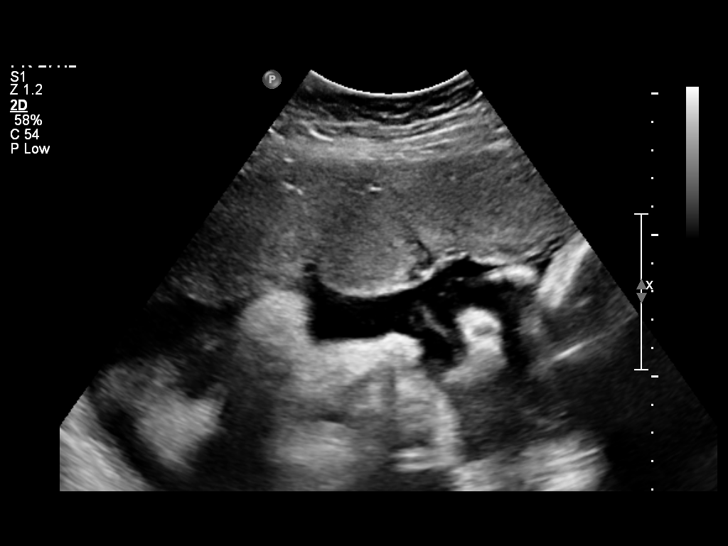
[im 8/19]
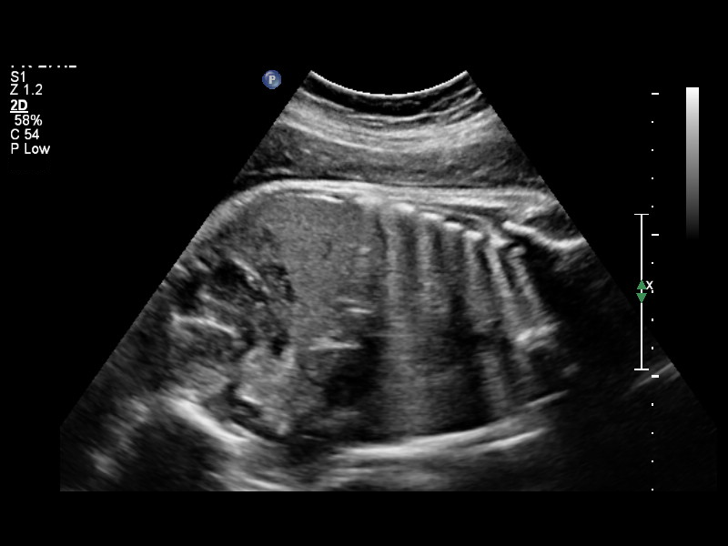
[im 9/19]
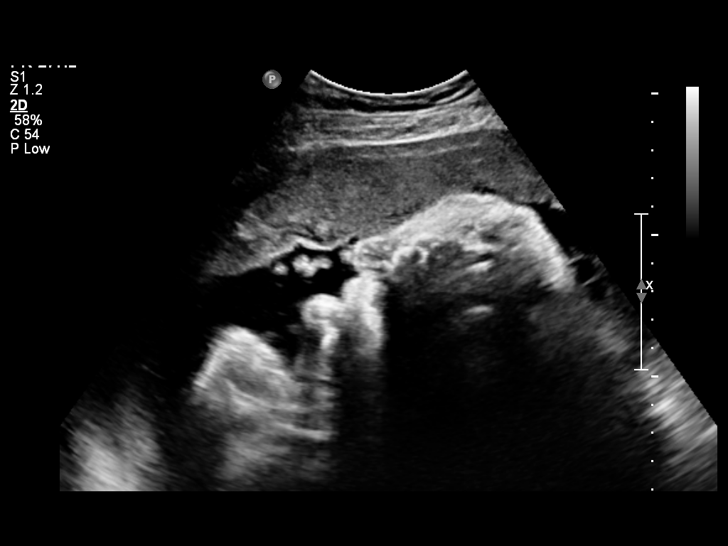
[im 11/19]
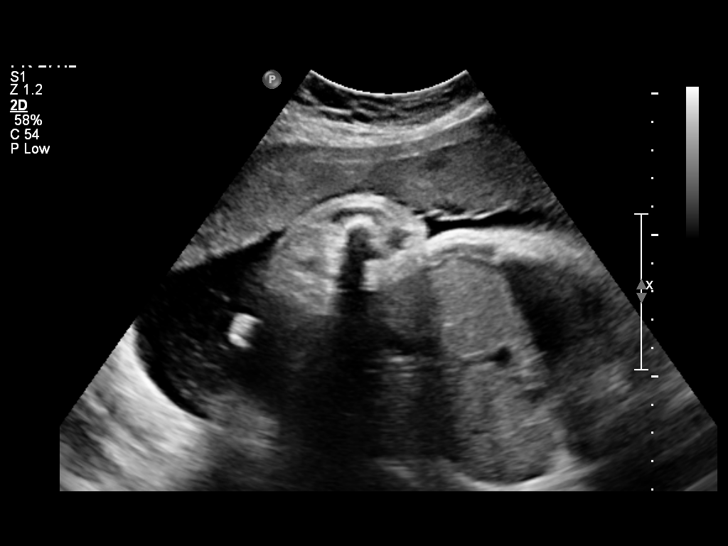
[im 12/19]
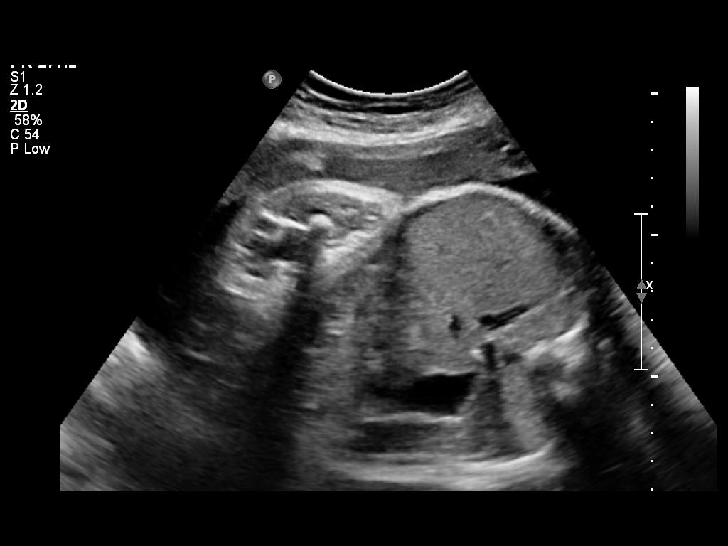
[im 13/19]
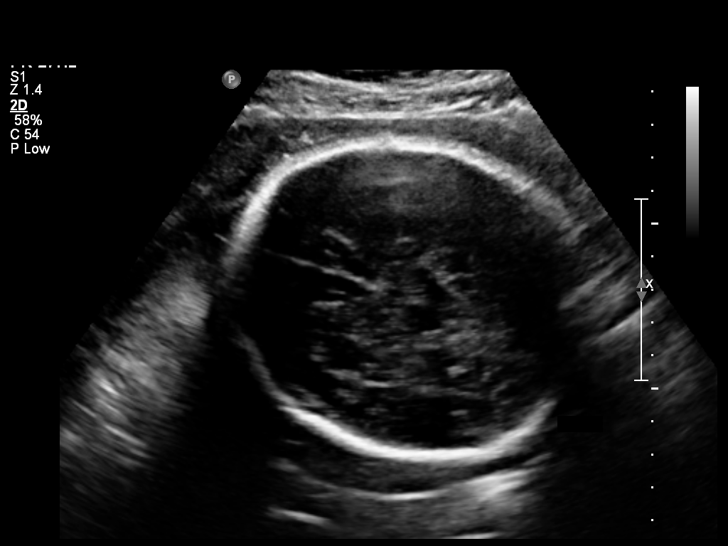
[im 15/19]
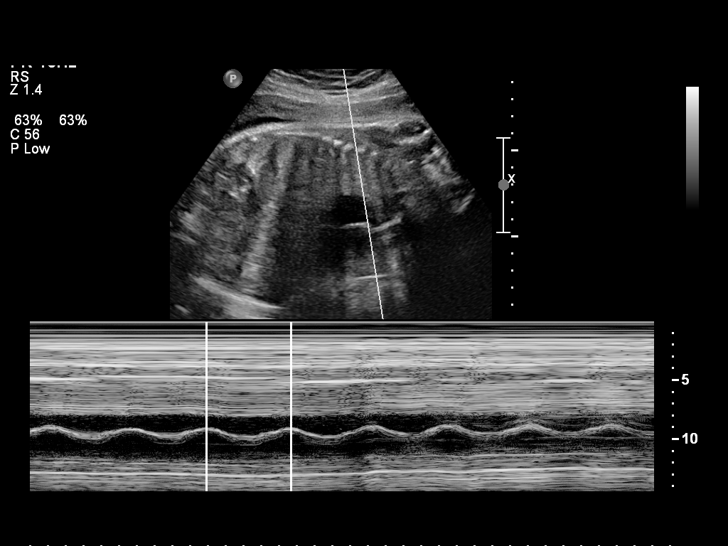
[im 16/19]
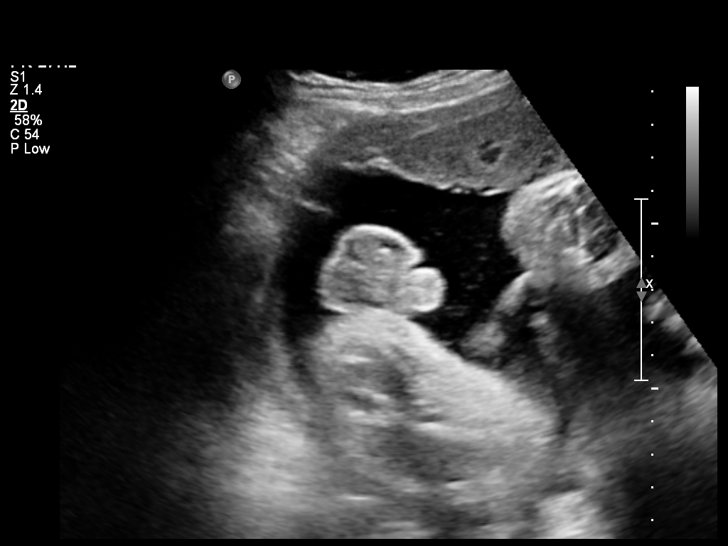
[im 17/19]
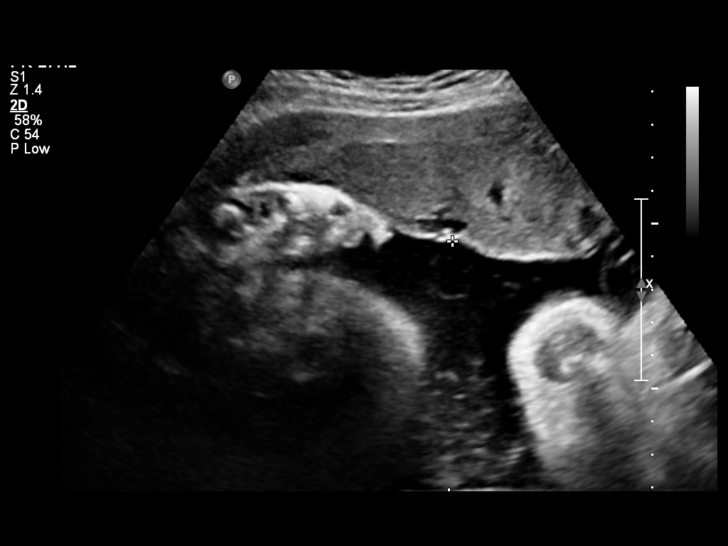
[im 19/19]
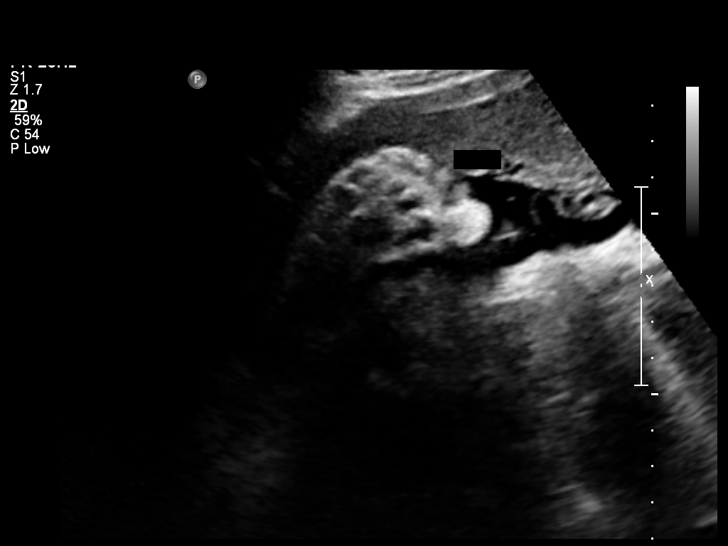

[14 of 19 positions shown; findings below may reference images not displayed]

OBSTETRICS REPORT
                      (Signed Final 12/17/2011 [DATE])

Procedures

Indications

 Hypertension - Gestational
 Assess fetal well being
Fetal Evaluation

 Fetal Heart Rate:  126                         bpm
 Cardiac Activity:  Observed
 Presentation:      Cephalic
 Placenta:          Anterior, above cervical os

 Amniotic Fluid
 AFI FV:      Subjectively within normal limits
                                             Larg Pckt:     7.6  cm
Biophysical Evaluation

 Amniotic F.V:   Within normal limits       F. Tone:        Observed
 F. Movement:    Observed                   Score:          [DATE]
 F. Breathing:   Observed
Gestational Age

 Clinical EDD:  37w 1d                                        EDD:   01/05/12
 Best:          37w 1d    Det. By:   Clinical EDD             EDD:   01/05/12
Cervix Uterus Adnexa

 Cervix:       Not visualized (advanced GA >34 wks)
Impression

 [DATE] BPP.

## 2014-05-24 LAB — OB RESULTS CONSOLE GC/CHLAMYDIA
Chlamydia: NEGATIVE
Gonorrhea: NEGATIVE

## 2014-05-24 LAB — OB RESULTS CONSOLE RPR: RPR: NONREACTIVE

## 2014-05-24 LAB — OB RESULTS CONSOLE HIV ANTIBODY (ROUTINE TESTING): HIV: NONREACTIVE

## 2014-06-09 ENCOUNTER — Inpatient Hospital Stay (HOSPITAL_COMMUNITY)
Admission: AD | Admit: 2014-06-09 | Discharge: 2014-06-10 | Disposition: A | Discharge status: Home / Self Care | Payer: BC Managed Care – PPO | Source: Ambulatory Visit | Attending: Obstetrics and Gynecology | Admitting: Obstetrics and Gynecology

## 2014-06-09 ENCOUNTER — Encounter (HOSPITAL_COMMUNITY): Payer: Self-pay

## 2014-06-09 DIAGNOSIS — O139 Gestational [pregnancy-induced] hypertension without significant proteinuria, unspecified trimester: Secondary | ICD-10-CM | POA: Insufficient documentation

## 2014-06-09 DIAGNOSIS — O209 Hemorrhage in early pregnancy, unspecified: Secondary | ICD-10-CM | POA: Insufficient documentation

## 2014-06-09 DIAGNOSIS — O2390 Unspecified genitourinary tract infection in pregnancy, unspecified trimester: Secondary | ICD-10-CM | POA: Insufficient documentation

## 2014-06-09 DIAGNOSIS — N390 Urinary tract infection, site not specified: Secondary | ICD-10-CM | POA: Insufficient documentation

## 2014-06-09 LAB — CBC
HEMATOCRIT: 37.5 % (ref 36.0–46.0)
HEMOGLOBIN: 13.2 g/dL (ref 12.0–15.0)
MCH: 30.5 pg (ref 26.0–34.0)
MCHC: 35.2 g/dL (ref 30.0–36.0)
MCV: 86.6 fL (ref 78.0–100.0)
Platelets: 217 10*3/uL (ref 150–400)
RBC: 4.33 MIL/uL (ref 3.87–5.11)
RDW: 12.9 % (ref 11.5–15.5)
WBC: 11.8 10*3/uL — ABNORMAL HIGH (ref 4.0–10.5)

## 2014-06-09 NOTE — MAU Note (Signed)
Light spotting x 1 week & dark blood with clots tonight, thinks passed tissues. Denies abdominal pain.

## 2014-06-10 ENCOUNTER — Inpatient Hospital Stay (HOSPITAL_COMMUNITY): Payer: BC Managed Care – PPO

## 2014-06-10 LAB — HCG, QUANTITATIVE, PREGNANCY: hCG, Beta Chain, Quant, S: 69593 m[IU]/mL — ABNORMAL HIGH (ref <5)

## 2014-06-10 NOTE — MAU Provider Note (Signed)
  History     CSN: [id number]  Arrival date and time: 06/09/14 2334   First Provider Initiated Contact with Patient 06/10/14 0042      Chief Complaint  Patient presents with  . Vaginal Bleeding   HPI Comments: Pt is a G3P2 at 7588w5d by LMP, arrives after calling CNM w c/o vag bleeding that started today, at first was pink, now bright red, also passing sm amt ?tissue/clot? On macrobid for asymptomatic UTI. Denies any pain.   Vaginal Bleeding      Past Medical History  Diagnosis Date  . Pregnancy induced hypertension     on labetalol since 37wks    Past Surgical History  Procedure Laterality Date  . No past surgeries      Family History  Problem Relation Age of Onset  . Anesthesia problems Neg Hx   . Hypotension Neg Hx   . Malignant hyperthermia Neg Hx   . Pseudochol deficiency Neg Hx     History  Substance Use Topics  . Smoking status: Never Smoker   . Smokeless tobacco: Not on file  . Alcohol Use: No    Allergies: No Known Allergies  Prescriptions prior to admission  Medication Sig Dispense Refill  . nitrofurantoin, macrocrystal-monohydrate, (MACROBID) 100 MG capsule Take 100 mg by mouth 2 (two) times daily.      . Prenatal Vit-Fe Fumarate-FA (PRENATAL MULTIVITAMIN) TABS Take 1 tablet by mouth at bedtime.         Review of Systems  Genitourinary: Positive for vaginal bleeding.  All other systems reviewed and are negative.  Physical Exam   Blood pressure 144/80, pulse 65, temperature 98.6 F (37 C), temperature source Oral, resp. rate 16, height 5\' 6"  (1.676 m), weight 189 lb 3.2 oz (85.821 kg), not currently breastfeeding.  Physical Exam  Nursing note and vitals reviewed. Constitutional: She is oriented to person, place, and time. She appears well-developed and well-nourished.  HENT:  Head: Normocephalic.  Eyes: Pupils are equal, round, and reactive to light.  Neck: Normal range of motion.  Cardiovascular: Normal rate.   Respiratory: Effort  normal.  GI: Soft.  Musculoskeletal: Normal range of motion.  Neurological: She is alert and oriented to person, place, and time. She has normal reflexes.  Skin: Skin is warm and dry.  Psychiatric: She has a normal mood and affect. Her behavior is normal.    MAU Course  Procedures    Assessment and Plan  IUP at 788w5d US c/w dates Cbc WNL rv'd pelvic rest w pt F/u routine unless bleeding increases or having pain  Josiyah Tozzi M 06/10/2014, 12:53 AM

## 2014-06-10 NOTE — Discharge Instructions (Signed)
Vaginal Bleeding During Pregnancy, First Trimester °A small amount of bleeding (spotting) from the vagina is relatively common in early pregnancy. It usually stops on its own. Various things may cause bleeding or spotting in early pregnancy. Some bleeding may be related to the pregnancy, and some may not. In most cases, the bleeding is normal and is not a problem. However, bleeding can also be a sign of something serious. Be sure to tell your health care provider about any vaginal bleeding right away. °Some possible causes of vaginal bleeding during the first trimester include: °· Infection or inflammation of the cervix. °· Growths (polyps) on the cervix. °· Miscarriage or threatened miscarriage. °· Pregnancy tissue has developed outside of the uterus and in a fallopian tube (tubal pregnancy). °· Tiny cysts have developed in the uterus instead of pregnancy tissue (molar pregnancy). °HOME CARE INSTRUCTIONS  °Watch your condition for any changes. The following actions may help to lessen any discomfort you are feeling: °· Follow your health care provider's instructions for limiting your activity. If your health care provider orders bed rest, you may need to stay in bed and only get up to use the bathroom. However, your health care provider may allow you to continue light activity. °· If needed, make plans for someone to help with your regular activities and responsibilities while you are on bed rest. °· Keep track of the number of pads you use each day, how often you change pads, and how soaked (saturated) they are. Write this down. °· Do not use tampons. Do not douche. °· Do not have sexual intercourse or orgasms until approved by your health care provider. °· If you pass any tissue from your vagina, save the tissue so you can show it to your health care provider. °· Only take over-the-counter or prescription medicines as directed by your health care provider. °· Do not take aspirin because it can make you  bleed. °· Keep all follow-up appointments as directed by your health care provider. °SEEK MEDICAL CARE IF: °· You have any vaginal bleeding during any part of your pregnancy. °· You have cramps or labor pains. °SEEK IMMEDIATE MEDICAL CARE IF:  °· You have severe cramps in your back or belly (abdomen). °· You have a fever, not controlled by medicine. °· You pass large clots or tissue from your vagina. °· Your bleeding increases. °· You feel lightheaded or weak, or you have fainting episodes. °· You have chills. °· You are leaking fluid or have a gush of fluid from your vagina. °· You pass out while having a bowel movement. °MAKE SURE YOU: °· Understand these instructions. °· Will watch your condition. °· Will get help right away if you are not doing well or get worse. °Document Released: 09/23/2005 Document Revised: 10/04/2013 Document Reviewed: 08/21/2013 °ExitCare® Patient Information ©2014 [employer]. ° °

## 2014-10-29 ENCOUNTER — Encounter (HOSPITAL_COMMUNITY): Payer: Self-pay

## 2014-12-04 LAB — OB RESULTS CONSOLE GBS: GBS: POSITIVE

## 2014-12-28 NOTE — L&D Delivery Note (Signed)
Delivery Note At 4:24 PM a viable female, "Diamond Dunn", was delivered via Vaginal, Spontaneous Delivery (Presentation: Right Occiput Anterior).  APGAR: 8, 9; weight  .   Placenta status: Intact, Spontaneous.  Cord: 3 vessels with the following complications: .  Cord pH: NA  Filed Vitals:   01/05/15 1532 01/05/15 1602 01/05/15 1633 01/05/15 1650  BP: 109/80 116/82 139/81 134/63  Pulse: 107 108 103 93  Temp:      TempSrc:      Resp:      Height:      Weight:      SpO2:        Anesthesia: Epidural  Episiotomy: None Lacerations: 2nd degree;Perineal--rectal exam showed no transection of capsule. Suture Repair: 3.0 vicryl Est. Blood Loss (mL):  400 cc  Mom to postpartum.  Baby to Couplet care / Skin to Skin. Will continue to monitor BP pp.    Nigel BridgemanLATHAM, Journei Thomassen 01/05/2015, 5:19 PM

## 2015-01-05 ENCOUNTER — Encounter (HOSPITAL_COMMUNITY): Payer: Self-pay

## 2015-01-05 ENCOUNTER — Inpatient Hospital Stay (HOSPITAL_COMMUNITY): Payer: Commercial Managed Care - PPO | Admitting: Anesthesiology

## 2015-01-05 ENCOUNTER — Inpatient Hospital Stay (HOSPITAL_COMMUNITY)
Admission: AD | Admit: 2015-01-05 | Discharge: 2015-01-07 | DRG: 775 | Disposition: A | Discharge status: Home / Self Care | Payer: Commercial Managed Care - PPO | Source: Ambulatory Visit | Attending: Obstetrics & Gynecology | Admitting: Obstetrics & Gynecology

## 2015-01-05 DIAGNOSIS — Z3A40 40 weeks gestation of pregnancy: Secondary | ICD-10-CM | POA: Diagnosis present

## 2015-01-05 DIAGNOSIS — O133 Gestational [pregnancy-induced] hypertension without significant proteinuria, third trimester: Secondary | ICD-10-CM | POA: Diagnosis present

## 2015-01-05 DIAGNOSIS — O99824 Streptococcus B carrier state complicating childbirth: Secondary | ICD-10-CM | POA: Diagnosis present

## 2015-01-05 DIAGNOSIS — Z3483 Encounter for supervision of other normal pregnancy, third trimester: Secondary | ICD-10-CM | POA: Diagnosis present

## 2015-01-05 DIAGNOSIS — O480 Post-term pregnancy: Secondary | ICD-10-CM | POA: Diagnosis present

## 2015-01-05 LAB — PROTEIN / CREATININE RATIO, URINE: Creatinine, Urine: 29 mg/dL

## 2015-01-05 LAB — URINALYSIS, ROUTINE W REFLEX MICROSCOPIC
Bilirubin Urine: NEGATIVE
GLUCOSE, UA: NEGATIVE mg/dL
HGB URINE DIPSTICK: NEGATIVE
Ketones, ur: NEGATIVE mg/dL
NITRITE: NEGATIVE
PROTEIN: NEGATIVE mg/dL
Specific Gravity, Urine: <1.005 — ABNORMAL LOW (ref 1.005–1.030)
Urobilinogen, UA: 0.2 mg/dL (ref 0.0–1.0)
pH: 6 (ref 5.0–8.0)

## 2015-01-05 LAB — COMPREHENSIVE METABOLIC PANEL
ALBUMIN: 2.8 g/dL — AB (ref 3.5–5.2)
ALT: 13 U/L (ref 0–35)
AST: 23 U/L (ref 0–37)
Alkaline Phosphatase: 148 U/L — ABNORMAL HIGH (ref 39–117)
Anion gap: 8 (ref 5–15)
BUN: 8 mg/dL (ref 6–23)
CALCIUM: 9.2 mg/dL (ref 8.4–10.5)
CO2: 21 mmol/L (ref 19–32)
Chloride: 107 mEq/L (ref 96–112)
Creatinine, Ser: 0.44 mg/dL — ABNORMAL LOW (ref 0.50–1.10)
GFR calc Af Amer: >90 mL/min (ref >90)
GFR calc non Af Amer: >90 mL/min (ref >90)
Glucose, Bld: 99 mg/dL (ref 70–99)
Potassium: 3.9 mmol/L (ref 3.5–5.1)
SODIUM: 136 mmol/L (ref 135–145)
TOTAL PROTEIN: 6 g/dL (ref 6.0–8.3)
Total Bilirubin: 0.3 mg/dL (ref 0.3–1.2)

## 2015-01-05 LAB — URINE MICROSCOPIC-ADD ON

## 2015-01-05 LAB — TYPE AND SCREEN
ABO/RH(D): O POS
ANTIBODY SCREEN: NEGATIVE

## 2015-01-05 LAB — CBC
HCT: 39.3 % (ref 36.0–46.0)
HEMOGLOBIN: 13.4 g/dL (ref 12.0–15.0)
MCH: 30.3 pg (ref 26.0–34.0)
MCHC: 34.1 g/dL (ref 30.0–36.0)
MCV: 88.9 fL (ref 78.0–100.0)
Platelets: 178 10*3/uL (ref 150–400)
RBC: 4.42 MIL/uL (ref 3.87–5.11)
RDW: 14.5 % (ref 11.5–15.5)
WBC: 11.2 10*3/uL — ABNORMAL HIGH (ref 4.0–10.5)

## 2015-01-05 LAB — LACTATE DEHYDROGENASE: LDH: 149 U/L (ref 94–250)

## 2015-01-05 LAB — URIC ACID: Uric Acid, Serum: 6.3 mg/dL (ref 2.4–7.0)

## 2015-01-05 MED ORDER — ONDANSETRON HCL 4 MG/2ML IJ SOLN: 4.0000 mg | Freq: Four times a day (QID) | INTRAMUSCULAR | Status: DC | PRN

## 2015-01-05 MED ORDER — OXYCODONE-ACETAMINOPHEN 5-325 MG PO TABS
1.0000 | ORAL_TABLET | ORAL | Status: DC | PRN
  Administered 2015-01-06 (×3): 1 via ORAL
  Filled 2015-01-05 (×3): qty 1

## 2015-01-05 MED ORDER — ZOLPIDEM TARTRATE 5 MG PO TABS: 5.0000 mg | ORAL_TABLET | Freq: Every evening | ORAL | Status: DC | PRN

## 2015-01-05 MED ORDER — PHENYLEPHRINE 40 MCG/ML (10ML) SYRINGE FOR IV PUSH (FOR BLOOD PRESSURE SUPPORT)
80.0000 ug | PREFILLED_SYRINGE | INTRAVENOUS | Status: DC | PRN
  Filled 2015-01-05: qty 20
  Filled 2015-01-05: qty 2

## 2015-01-05 MED ORDER — FLEET ENEMA 7-19 GM/118ML RE ENEM: 1.0000 | ENEMA | RECTAL | Status: DC | PRN

## 2015-01-05 MED ORDER — DIPHENHYDRAMINE HCL 50 MG/ML IJ SOLN: 12.5000 mg | INTRAMUSCULAR | Status: DC | PRN

## 2015-01-05 MED ORDER — WITCH HAZEL-GLYCERIN EX PADS: 1.0000 "application " | MEDICATED_PAD | CUTANEOUS | Status: DC | PRN

## 2015-01-05 MED ORDER — OXYCODONE-ACETAMINOPHEN 5-325 MG PO TABS: 2.0000 | ORAL_TABLET | ORAL | Status: DC | PRN

## 2015-01-05 MED ORDER — PRENATAL MULTIVITAMIN CH
1.0000 | ORAL_TABLET | Freq: Every day | ORAL | Status: DC
  Administered 2015-01-06: 1 via ORAL
  Filled 2015-01-05: qty 1

## 2015-01-05 MED ORDER — SENNOSIDES-DOCUSATE SODIUM 8.6-50 MG PO TABS
2.0000 | ORAL_TABLET | ORAL | Status: DC
  Administered 2015-01-05 – 2015-01-06 (×3): 2 via ORAL
  Filled 2015-01-05 (×3): qty 2

## 2015-01-05 MED ORDER — DIBUCAINE 1 % RE OINT
1.0000 | TOPICAL_OINTMENT | RECTAL | Status: DC | PRN
Start: 2015-01-05 — End: 2015-01-07

## 2015-01-05 MED ORDER — PENICILLIN G POTASSIUM 5000000 UNITS IJ SOLR
5.0000 10*6.[IU] | Freq: Once | INTRAVENOUS | Status: AC
  Administered 2015-01-05: 5 10*6.[IU] via INTRAVENOUS
  Filled 2015-01-05: qty 5

## 2015-01-05 MED ORDER — EPHEDRINE 5 MG/ML INJ
10.0000 mg | INTRAVENOUS | Status: DC | PRN
  Filled 2015-01-05: qty 2

## 2015-01-05 MED ORDER — LANOLIN HYDROUS EX OINT: TOPICAL_OINTMENT | CUTANEOUS | Status: DC | PRN

## 2015-01-05 MED ORDER — LACTATED RINGERS IV SOLN: 500.0000 mL | INTRAVENOUS | Status: DC | PRN

## 2015-01-05 MED ORDER — IBUPROFEN 600 MG PO TABS
600.0000 mg | ORAL_TABLET | Freq: Four times a day (QID) | ORAL | Status: DC
  Administered 2015-01-05 – 2015-01-07 (×7): 600 mg via ORAL
  Filled 2015-01-05 (×7): qty 1

## 2015-01-05 MED ORDER — PENICILLIN G POTASSIUM 5000000 UNITS IJ SOLR
2.5000 10*6.[IU] | INTRAVENOUS | Status: DC
  Administered 2015-01-05: 2.5 10*6.[IU] via INTRAVENOUS
  Filled 2015-01-05 (×4): qty 2.5

## 2015-01-05 MED ORDER — LIDOCAINE HCL (PF) 1 % IJ SOLN
30.0000 mL | INTRAMUSCULAR | Status: DC | PRN
Start: 2015-01-05 — End: 2015-01-07
  Administered 2015-01-05: 30 mL via SUBCUTANEOUS
  Filled 2015-01-05: qty 30

## 2015-01-05 MED ORDER — OXYTOCIN BOLUS FROM INFUSION
500.0000 mL | INTRAVENOUS | Status: DC
  Administered 2015-01-05: 500 mL via INTRAVENOUS

## 2015-01-05 MED ORDER — TETANUS-DIPHTH-ACELL PERTUSSIS 5-2.5-18.5 LF-MCG/0.5 IM SUSP: 0.5000 mL | Freq: Once | INTRAMUSCULAR | Status: DC

## 2015-01-05 MED ORDER — LACTATED RINGERS IV SOLN
INTRAVENOUS | Status: DC
  Administered 2015-01-05 (×2): via INTRAVENOUS

## 2015-01-05 MED ORDER — LACTATED RINGERS IV SOLN: 500.0000 mL | Freq: Once | INTRAVENOUS | Status: DC

## 2015-01-05 MED ORDER — CITRIC ACID-SODIUM CITRATE 334-500 MG/5ML PO SOLN: 30.0000 mL | ORAL | Status: DC | PRN

## 2015-01-05 MED ORDER — OXYCODONE-ACETAMINOPHEN 5-325 MG PO TABS: 1.0000 | ORAL_TABLET | ORAL | Status: DC | PRN

## 2015-01-05 MED ORDER — SIMETHICONE 80 MG PO CHEW: 80.0000 mg | CHEWABLE_TABLET | ORAL | Status: DC | PRN

## 2015-01-05 MED ORDER — PHENYLEPHRINE 40 MCG/ML (10ML) SYRINGE FOR IV PUSH (FOR BLOOD PRESSURE SUPPORT)
80.0000 ug | PREFILLED_SYRINGE | INTRAVENOUS | Status: DC | PRN
  Filled 2015-01-05: qty 2

## 2015-01-05 MED ORDER — BENZOCAINE-MENTHOL 20-0.5 % EX AERO
1.0000 "application " | INHALATION_SPRAY | CUTANEOUS | Status: DC | PRN
  Filled 2015-01-05: qty 56

## 2015-01-05 MED ORDER — FENTANYL 2.5 MCG/ML BUPIVACAINE 1/10 % EPIDURAL INFUSION (WH - ANES)
INTRAMUSCULAR | Status: DC | PRN
  Administered 2015-01-05: 14 mL/h via EPIDURAL

## 2015-01-05 MED ORDER — FENTANYL 2.5 MCG/ML BUPIVACAINE 1/10 % EPIDURAL INFUSION (WH - ANES)
14.0000 mL/h | INTRAMUSCULAR | Status: DC | PRN
  Filled 2015-01-05: qty 125

## 2015-01-05 MED ORDER — ONDANSETRON HCL 4 MG PO TABS: 4.0000 mg | ORAL_TABLET | ORAL | Status: DC | PRN

## 2015-01-05 MED ORDER — OXYTOCIN 40 UNITS IN LACTATED RINGERS INFUSION - SIMPLE MED
62.5000 mL/h | INTRAVENOUS | Status: DC
  Administered 2015-01-05: 62.5 mL/h via INTRAVENOUS
  Filled 2015-01-05: qty 1000

## 2015-01-05 MED ORDER — ONDANSETRON HCL 4 MG/2ML IJ SOLN: 4.0000 mg | INTRAMUSCULAR | Status: DC | PRN

## 2015-01-05 MED ORDER — LIDOCAINE HCL (PF) 1 % IJ SOLN
INTRAMUSCULAR | Status: DC | PRN
  Administered 2015-01-05 (×2): 4 mL

## 2015-01-05 MED ORDER — DIPHENHYDRAMINE HCL 25 MG PO CAPS: 25.0000 mg | ORAL_CAPSULE | Freq: Four times a day (QID) | ORAL | Status: DC | PRN

## 2015-01-05 MED ORDER — LABETALOL HCL 5 MG/ML IV SOLN: 10.0000 mg | INTRAVENOUS | Status: DC | PRN

## 2015-01-05 MED ORDER — BUPIVACAINE HCL (PF) 0.25 % IJ SOLN
INTRAMUSCULAR | Status: DC | PRN
  Administered 2015-01-05: 5 mL

## 2015-01-05 MED ORDER — ACETAMINOPHEN 325 MG PO TABS: 650.0000 mg | ORAL_TABLET | ORAL | Status: DC | PRN

## 2015-01-05 MED ORDER — BUTORPHANOL TARTRATE 1 MG/ML IJ SOLN: 1.0000 mg | INTRAMUSCULAR | Status: DC | PRN

## 2015-01-05 NOTE — MAU Note (Signed)
Per HMitchell, RN charge, pt to go to room 162 

## 2015-01-05 NOTE — Progress Notes (Signed)
  Subjective: Getting comfortable s/p epidural.  Less anxious now.  Objective: BP 109/80 mmHg  Pulse 107  Temp(Src) 98 F (36.7 C) (Oral)  Resp 18  Ht 5\' 6"  (1.676 m)  Wt 223 lb (101.152 kg)  BMI 36.01 kg/m2  SpO2 99%      FHT: Category 1 UC:   regular, every 4 minutes SVE:   Dilation: 8.5 Effacement (%): 100 Station: -1 Exam by:: [employer]atham  AROM--light MSF   Assessment:  Progressive labor  Plan: Await further progress. Support to patient for anxieties/concerns.  Nigel BridgemanLATHAM, Torence Palmeri CNM 01/05/2015, 3:36 PM

## 2015-01-05 NOTE — H&P (Signed)
Diamond HarborBrianne Dunn is a 34 y.o. female, G3P2002 at 6540 4/7 weeks, presenting for UCs since early am, still irregular in quality and pattern.  Denies leaking or bleeding, reports +FM.  Denies HA, visual sx, or epigastric pain.  Hx gestational hypertension during previous pregnancies, no issues this pregnancy.  Patient Active Problem List   Diagnosis Date Noted  . Normal labor 01/05/2015  . Third degree perineal laceration 07/10/2013  . Gestational hypertension 01/08/2012  . Group B streptococcal infection in pregnancy 01/08/2012    History of present pregnancy: Patient entered care at approx 9 weeks.   EDC of 01/01/15 was established by US at 10 5/7 weeks  Anatomy scan:  19 weeks, with normal findings and a posterior placenta.   Additional US evaluations:  None Significant prenatal events:  Seen in MAU at 10 5/7 weeks for bleeding, with normal US.  Declined genetic testing.  TDAP 10/04/14.  Foreign object in ear at 31 weeks, with irrigation at office visit, but object never dislodged as far as patient knows.  Referred to PCP, did not go due to resolution of sx.  GBS positive at 36 weeks.  Varicosities in left leg and perineum noted at 36 weeks. BP range during pregnancy 110s-130s/68-80. Last evaluation:  01/01/15, cervix  2 cm, 50%, vtx, -3.  BP 122/78.  OB History    Gravida Para Term Preterm AB TAB SAB Ectopic Multiple Living   3 2 2  0 0 0 0 0 0 2    2013--[facility], 40 weeks, 11 hour labor, 6+11, female, local.  On labetalol from 37 weeks  2014--[facility], 40 3/7 weeks, 4 hour labor, 8+14, 3rd degree laceration, waterbirth.  Gestational hypertension, no meds, noted at 39 weeks.  Past Medical History  Diagnosis Date  . Pregnancy induced hypertension     on labetalol since 37wks  On Labetalol during 1st pregnancy from 37 weeks. No meds in 2nd pregnancy  Past Surgical History  Procedure Laterality Date  . No past surgeries     Family History: family history is negative for Anesthesia problems,  Hypotension, Malignant hyperthermia, and Pseudochol deficiency.   Social History:  reports that she has never smoked. She does not have any smokeless tobacco history on file. She reports that she does not drink alcohol or use illicit drugs.  Patient is Caucasian, married to Madelaine Bhatdam, who is involved and supportive.  She is a stay-at-home mom.   Prenatal Transfer Tool  Maternal Diabetes: No Genetic Screening: Declined Maternal Ultrasounds/Referrals: Normal Fetal Ultrasounds or other Referrals:  None Maternal Substance Abuse:  No Significant Maternal Medications:  None Significant Maternal Lab Results: Lab values include: Group B Strep positive  TDAP 10/04/14 Flu 11/08/14  ROS:  Contractions, +FM  No Known Allergies     Chest clear Heart RRR without murmur Abd gravid, NT, FH 40 cm Pelvic: 6 cm, 90%, vtx, -1, IBOW Ext: DTR 1-2+, no clonus, 1+ edema  FHR: Category 2--non-reactive, no decels. UCs:  q 3-5 min, moderate  Prenatal labs: ABO, Rh:  O+ Antibody:  Neg Rubella:   Immune RPR:   NR HBsAg:   Neg HIV:   NR GBS:  Positive 12/04/14 Sickle cell/Hgb electrophoresis:  NA Pap:  WNL 07/2013 GC:  Negative 05/24/14 Chlamydia:  Negative 05/24/14 Genetic screenings:  Declined Glucola:  WNL Other:   Hgb 13.2 at NOB, 12.8 at 28 weeks  Assessment/Plan: IUP at 40 4/7 weeks Early labor, with advanced cervical dilation Category 2 FHR GBS positive Gestational hypertension  Plan: Admit to  Birthing Suite per consult with Dr. Charlotta Newton Routine [facility] orders Considering birth options--hx of unmedicated birth x 2, but considering pain med use prn. GBS prophylaxis with PCN G. IV hydration PIH labs and PCR  Lyndel Sarate, [place], [place] 01/05/2015, 9:59 AM

## 2015-01-05 NOTE — Anesthesia Preprocedure Evaluation (Signed)
Anesthesia Evaluation  Patient identified by MRN, date of birth, ID band Patient awake    Reviewed: Allergy & Precautions, NPO status , Patient's Chart, lab work & pertinent test results  History of Anesthesia Complications Negative for: history of anesthetic complications  Airway Mallampati: II  TM Distance: >3 FB Neck ROM: Full    Dental no notable dental hx. (+) Dental Advisory Given   Pulmonary neg pulmonary ROS,  breath sounds clear to auscultation  Pulmonary exam normal       Cardiovascular hypertension (hx of PIH), Rhythm:Regular Rate:Normal     Neuro/Psych negative neurological ROS  negative psych ROS   GI/Hepatic negative GI ROS, Neg liver ROS,   Endo/Other  negative endocrine ROSObesity   Renal/GU negative Renal ROS  negative genitourinary   Musculoskeletal negative musculoskeletal ROS (+)   Abdominal   Peds negative pediatric ROS (+)  Hematology negative hematology ROS (+)   Anesthesia Other Findings   Reproductive/Obstetrics (+) Pregnancy                             Anesthesia Physical Anesthesia Plan  ASA: II  Anesthesia Plan: Epidural   Post-op Pain Management:    Induction:   Airway Management Planned:   Additional Equipment:   Intra-op Plan:   Post-operative Plan:   Informed Consent: I have reviewed the patients History and Physical, chart, labs and discussed the procedure including the risks, benefits and alternatives for the proposed anesthesia with the patient or authorized representative who has indicated his/her understanding and acceptance.   Dental advisory given  Plan Discussed with:   Anesthesia Plan Comments:         Anesthesia Quick Evaluation

## 2015-01-05 NOTE — Progress Notes (Signed)
  Subjective: Very anxious about pain of transition/birth--considering epidural.  Also stressed due to different process of labor than previous 2 labors.  Husband very supportive.  Objective: BP 142/80 mmHg  Pulse 105  Temp(Src) 98 F (36.7 C) (Oral)  Resp 18  Ht 5\' 6"  (1.676 m)  Wt 223 lb (101.152 kg)  BMI 36.01 kg/m2    Completed 1st dose ATB  FHT: Category 1 UC:   regular, every 4 minutes SVE:   Dilation: 7.5 Effacement (%): 100 Station: -1 Exam by:: [employer]atham  Assessment:  Progressive labor GBS positive  Plan: Reviewed options--AROM, epidural. Patient elects to proceed with epidural. Will plan AROM after epidural placed. Support to patient for anxieties and stressors.   Nigel BridgemanLATHAM, Avangelina Flight CNM 01/05/2015, 1:57 PM

## 2015-01-05 NOTE — Progress Notes (Signed)
[facility] pt's BP 144/91 Heart Rate 107. No further orders given.

## 2015-01-05 NOTE — Progress Notes (Signed)
Subjective: Now on Birthing Suite--UCs remain irregular, mild at present.  Patient hungry.  Objective: BP 153/94 mmHg  Pulse 100  Temp(Src) 98 F (36.7 C) (Oral)  Resp 20  Ht 5\' 6"  (1.676 m)  Wt 223 lb (101.152 kg)  BMI 36.01 kg/m2      Filed Vitals:   01/05/15 1009 01/05/15 1105 01/05/15 1112  BP: 142/99 153/94   Pulse: 115 100   Temp: 98.7 F (37.1 C) 98 F (36.7 C)   TempSrc: Oral Oral   Resp: 18 20   Height:   5\' 6"  (1.676 m)  Weight:   223 lb (101.152 kg)     FHT: Category 2 at present--had segment of Category 1. UC:   irregular, every 5-9 minutes SVE:  Deferred at present     Results for orders placed or performed during the hospital encounter of 01/05/15 (from the past 24 hour(s))  Urinalysis, Routine w reflex microscopic     Status: Abnormal   Collection Time: 01/05/15  9:50 AM  Result Value Ref Range   Color, Urine YELLOW YELLOW   APPearance CLEAR CLEAR   Specific Gravity, Urine <1.005 (L) 1.005 - 1.030   pH 6.0 5.0 - 8.0   Glucose, UA NEGATIVE NEGATIVE mg/dL   Hgb urine dipstick NEGATIVE NEGATIVE   Bilirubin Urine NEGATIVE NEGATIVE   Ketones, ur NEGATIVE NEGATIVE mg/dL   Protein, ur NEGATIVE NEGATIVE mg/dL   Urobilinogen, UA 0.2 0.0 - 1.0 mg/dL   Nitrite NEGATIVE NEGATIVE   Leukocytes, UA TRACE (A) NEGATIVE  Urine microscopic-add on     Status: None   Collection Time: 01/05/15  9:50 AM  Result Value Ref Range   Squamous Epithelial / LPF RARE RARE   WBC, UA 0-2 <3 WBC/hpf   Bacteria, UA RARE RARE  CBC     Status: Abnormal   Collection Time: 01/05/15 10:35 AM  Result Value Ref Range   WBC 11.2 (H) 4.0 - 10.5 K/uL   RBC 4.42 3.87 - 5.11 MIL/uL   Hemoglobin 13.4 12.0 - 15.0 g/dL   HCT 40.939.3 81.136.0 - 91.446.0 %   MCV 88.9 78.0 - 100.0 fL   MCH 30.3 26.0 - 34.0 pg   MCHC 34.1 30.0 - 36.0 g/dL   RDW 78.214.5 95.611.5 - 21.315.5 %   Platelets 178 150 - 400 K/uL  Comprehensive metabolic panel     Status: Abnormal   Collection Time: 01/05/15 10:35 AM  Result Value  Ref Range   Sodium 136 135 - 145 mmol/L   Potassium 3.9 3.5 - 5.1 mmol/L   Chloride 107 96 - 112 mEq/L   CO2 21 19 - 32 mmol/L   Glucose, Bld 99 70 - 99 mg/dL   BUN 8 6 - 23 mg/dL   Creatinine, Ser 0.860.44 (L) 0.50 - 1.10 mg/dL   Calcium 9.2 8.4 - 57.810.5 mg/dL   Total Protein 6.0 6.0 - 8.3 g/dL   Albumin 2.8 (L) 3.5 - 5.2 g/dL   AST 23 0 - 37 U/L   ALT 13 0 - 35 U/L   Alkaline Phosphatase 148 (H) 39 - 117 U/L   Total Bilirubin 0.3 0.3 - 1.2 mg/dL   GFR calc non Af Amer >90 >90 mL/min   GFR calc Af Amer >90 >90 mL/min   Anion gap 8 5 - 15  Lactate dehydrogenase     Status: None   Collection Time: 01/05/15 10:35 AM  Result Value Ref Range   LDH 149 94 - 250 U/L  Uric acid     Status: None   Collection Time: 01/05/15 10:35 AM  Result Value Ref Range   Uric Acid, Serum 6.3 2.4 - 7.0 mg/dL   1st dose ATB initiated.   Assessment:  Early labor, advanced dilation GBS positive Gestational hypertension  Plan: Continue to monitor BP Labetalol IV prn BP parameters Await completion of IV ATB--consider AROM after ATB infused.  Nigel Bridgeman CNM 01/05/2015, 11:23 AM

## 2015-01-05 NOTE — Anesthesia Procedure Notes (Signed)
Epidural Patient location during procedure: OB Start time: 01/05/2015 1:48 PM End time: 01/05/2015 1:52 PM  Staffing Anesthesiologist: Felipe DroneJUDD, Luigi Stuckey JENNETTE Performed by: anesthesiologist   Preanesthetic Checklist Completed: patient identified, site marked, surgical consent, pre-op evaluation, timeout performed, IV checked, risks and benefits discussed and monitors and equipment checked  Epidural Patient position: sitting Prep: site prepped and draped and DuraPrep Patient monitoring: continuous pulse ox and blood pressure Approach: midline Location: L3-L4 Injection technique: LOR saline  Needle:  Needle type: Tuohy  Needle gauge: 17 G Needle length: 9 cm and 9 Needle insertion depth: 6 cm Catheter type: closed end flexible Catheter size: 19 Gauge Catheter at skin depth: 10 cm Test dose: negative  Assessment Events: blood not aspirated, injection not painful, no injection resistance, negative IV test and no paresthesia  Additional Notes Patient identified. Risks/Benefits/Options discussed with patient including but not limited to bleeding, infection, nerve damage, paralysis, failed block, incomplete pain control, headache, blood pressure changes, nausea, vomiting, reactions to medication both or allergic, itching and postpartum back pain. Confirmed with bedside nurse the patient's most recent platelet count. Confirmed with patient that they are not currently taking any anticoagulation, have any bleeding history or any family history of bleeding disorders. Patient expressed understanding and wished to proceed. All questions were answered. Sterile technique was used throughout the entire procedure. Please see nursing notes for vital signs. Test dose was given through epidural catheter and negative prior to continuing to dose epidural or start infusion. Warning signs of high block given to the patient including shortness of breath, tingling/numbness in hands, complete motor block, or any  concerning symptoms with instructions to call for help. Patient was given instructions on fall risk and not to get out of bed. All questions and concerns addressed with instructions to call with any issues or inadequate analgesia.

## 2015-01-06 LAB — HIV ANTIBODY (ROUTINE TESTING W REFLEX)
HIV 1/HIV 2 AB: NONREACTIVE
HIV 1/O/2 Abs-Index Value: <1 (ref <1.00)

## 2015-01-06 LAB — CBC
HEMATOCRIT: 33.4 % — AB (ref 36.0–46.0)
HEMOGLOBIN: 11.1 g/dL — AB (ref 12.0–15.0)
MCH: 29.8 pg (ref 26.0–34.0)
MCHC: 33.2 g/dL (ref 30.0–36.0)
MCV: 89.5 fL (ref 78.0–100.0)
Platelets: 142 10*3/uL — ABNORMAL LOW (ref 150–400)
RBC: 3.73 MIL/uL — AB (ref 3.87–5.11)
RDW: 14.7 % (ref 11.5–15.5)
WBC: 12 10*3/uL — AB (ref 4.0–10.5)

## 2015-01-06 LAB — RPR: RPR Ser Ql: NONREACTIVE

## 2015-01-06 NOTE — Anesthesia Postprocedure Evaluation (Signed)
  Anesthesia Post-op Note  Anesthesia Post Note  Patient: Diamond Dunn  Procedure(s) Performed: * No procedures listed *  Anesthesia type: Epidural  Patient location: Mother/Baby  Post pain: Pain level controlled  Post assessment: Post-op Vital signs reviewed  Last Vitals:  Filed Vitals:   01/06/15 0734  BP: 115/74  Pulse: 79  Temp:   Resp:     Post vital signs: Reviewed  Level of consciousness:alert  Complications: No apparent anesthesia complications

## 2015-01-06 NOTE — Lactation Note (Signed)
This note was copied from the chart of Diamond Angel Rezabek. Lactation Consultation Note  Patient Name: Diamond Mikki HarborBrianne Modeste Today's Date: 01/06/2015 Reason for consult: Initial assessment Mom reports BF is going well but reports some concern this baby does not have as strong a suckle as her boys. She is able to express colostrum with nursing and the baby has been to the breast several times. Mom reports "just different from my boys". LC reassured Mom every baby is different and not worry as long as baby is nursing well, adequate I/O, weight loss within normal limits in the 1st week. Mom reports her milk was in by 2-3 days with her boys. Mom denies other questions/concerns. Lactation brochure left for review, advised of OP services if desired. Encouraged to call if she would like assist.   Maternal Data Has patient been taught Hand Expression?: No (Mom experienced BF and reports she knows how to hand express) Does the patient have breastfeeding experience prior to this delivery?: Yes  Feeding Feeding Type: Breast Fed Length of feed: 0 min  LATCH Score/Interventions                      Lactation Tools Discussed/Used     Consult Status Consult Status: PRN Date: 01/07/15 Follow-up type: In-patient    Alfred LevinsGranger, Aletha Allebach Ann 01/06/2015, 4:17 PM

## 2015-01-06 NOTE — Progress Notes (Addendum)
[facility] pt's vital signs of 152/87 Pulse 80 Resp 18. No other PIH symptoms except generalized edema around ankles. Pain level is 5, mom states the cramping is worse this pregnancy. No further orders. Just continue to monitor.

## 2015-01-06 NOTE — Progress Notes (Signed)
Diamond Dunn   Subjective: Post Partum Day 1 Vaginal delivery, 2 degree laceration Patient up ad lib, denies syncope or dizziness. Reports consuming regular diet without issues and denies N/V No issues with urination and reports bleeding is appropriate  Feeding:  breast Contraceptive plan:   unsure  Objective: Temp:  [97.5 F (36.4 C)-98.4 F (36.9 C)] 97.9 F (36.6 C) (01/10 0350) Pulse Rate:  [79-119] 79 (01/10 0734) Resp:  [18] 18 (01/10 0350) BP: (101-162)/(43-137) 115/74 mmHg (01/10 0734) SpO2:  [98 %-100 %] 98 % (01/09 1825)  Physical Exam:  General: alert and cooperative Ext: WNL, no edema. No evidence of DVT seen on physical exam. Breast: Soft filling Lungs: CTAB Heart RRR without murmur  Abdomen:  Soft, fundus firm, lochia scant, + bowel sounds, non distended, non tender Lochia: appropriate Uterine Fundus: firm Laceration: healing well    Recent Labs  01/05/15 1035 01/06/15 0559  HGB 13.4 11.1*  HCT 39.3 33.4*    Assessment S/P Vaginal Delivery-Day 1 Stable  Normal Involution Breastfeeding   Plan: Continue current care Plan for discharge tomorrow, Breastfeeding and Lactation consult Lactation support   Teon Hudnall, CNM, MSN 01/06/2015, 12:18 PM

## 2015-01-07 MED ORDER — IBUPROFEN 600 MG PO TABS: 600.0000 mg | ORAL_TABLET | Freq: Four times a day (QID) | ORAL | Status: DC | PRN

## 2015-01-07 NOTE — Lactation Note (Signed)
This note was copied from the chart of Diamond Vernita Morin. Lactation Consultation Note  P3, Mother concerned because this baby latches differently than her other children. Encouraged mother that every baby is different.  Mother has comfort gels for soreness. Discussed engorgement care and monitoring voids/stools. Suggest she attend support group and call if she needs further assistance.  Patient Name: Diamond Dunn [id number]'UToday's Date: 01/07/2015 Reason for consult: Follow-up assessment   Maternal Data    Feeding Feeding Type: Breast Fed Length of feed: 25 min  LATCH Score/Interventions Latch: Grasps breast easily, tongue down, lips flanged, rhythmical sucking.  Audible Swallowing: Spontaneous and intermittent  Type of Nipple: Everted at rest and after stimulation  Comfort (Breast/Nipple): Filling, red/small blisters or bruises, mild/mod discomfort  Problem noted: Mild/Moderate discomfort Interventions (Mild/moderate discomfort): Comfort gels;Hand massage;Hand expression  Hold (Positioning): No assistance needed to correctly position infant at breast.  LATCH Score: 9  Lactation Tools Discussed/Used     Consult Status Consult Status: Complete    Hardie PulleyBerkelhammer, Ruth Boschen 01/07/2015, 9:54 AM

## 2015-01-07 NOTE — Discharge Summary (Signed)
  Vaginal Delivery Discharge Summary  Diamond Dunn  DOB:    09-17-1981 MRN:    [id number] CSN:    [id number]  Date of admission:                  Jan 05, 2015  Date of discharge:                   Jan 07, 2015  Procedures this admission:   SVD with 2nd Degree Laceration  Date of Delivery: Jan 05, 2015  Newborn Data:  Live born female  Birth Weight: 8 lb 11.5 oz (3955 g) APGAR: 8, 9  Home with mother. Name: Diamond Dunn Circumcision Plan: None  History of Present Illness:  [name] is a 34 y.o. female, G3P3003, who presents at 6743w4d weeks gestation. [facility]. She was admitted onset of labor. Her pregnancy has been complicated by:  Patient Active Problem List   Diagnosis Date Noted  . Vaginal delivery 01/05/2015  . Gestational hypertension 01/08/2012  . Group B streptococcal infection in pregnancy 01/08/2012     Hospital Course:  Admitted in active labor. Positive GBS. Progressed without intervention. Utilized epidural for pain management.  Delivery was performed by V. Emilee HeroLatham, CNM without complication. Patient and baby tolerated the procedure without difficulty, with  Deep 2nd laceration noted and rectal capsule noted to be intact. Infant status was stable and remained in room with mother.  Mother and infant then had an uncomplicated postpartum course, with breast feeding going well. However, mother did have some issues with blood pressure, but never required any PO or IV intervention.  Mom's physical exam was WNL, and she was discharged home in stable condition. Contraception plan was condoms.  She received adequate benefit from po pain medications.   Feeding:  breast  Contraception:  condoms  Discharge hemoglobin:  HEMOGLOBIN  Date Value Ref Range Status  01/06/2015 11.1* 12.0 - 15.0 g/dL Final    Comment:    REPEATED TO VERIFY DELTA CHECK  NOTED    HCT  Date Value Ref Range Status  01/06/2015 33.4* 36.0 - 46.0 % Final    Discharge Physical Exam:   General: alert, cooperative, fatigued and no distress Lochia: appropriate Uterine Fundus: firm, U/-2 Incision: N/A DVT Evaluation: No evidence of DVT seen on physical exam. No significant calf/ankle edema.  Intrapartum Procedures: spontaneous vaginal delivery Postpartum Procedures: none Complications-Operative and Postpartum: 2nd degree perineal laceration  Discharge Diagnoses: Term Pregnancy-delivered  Discharge Information:  Activity:           pelvic rest Diet:                routine Medications: PNV and Ibuprofen Condition:      stable Instructions:  Pain Management, Peri-Care, Breastfeeding, S/S of PreEclampsia, Who and When to call for postpartum complications. Information Sheet on PP BB & Depression.   Discharge to: home  Follow-up Information    [facility]. Schedule an appointment as soon as possible for a visit in 6 weeks.   Specialty:  Obstetrics and Gynecology   Why:  Please call if you have any questions or concerns prior to your next visit.   Contact information:   [address] [place] [phone]       Marlene BastMLY, Khiyan Crace LYNN MSN, CNM 01/07/2015 8:31 AM

## 2015-01-07 NOTE — Discharge Instructions (Signed)
Postpartum Depression and Baby Blues The postpartum period begins right after the birth of a baby. During this time, there is often a great amount of joy and excitement. It is also a time of many changes in the life of the parents. Regardless of how many times a mother gives birth, each child brings new challenges and dynamics to the family. It is not unusual to have feelings of excitement along with confusing shifts in moods, emotions, and thoughts. All mothers are at risk of developing postpartum depression or the "baby blues." These mood changes can occur right after giving birth, or they may occur many months after giving birth. The baby blues or postpartum depression can be mild or severe. Additionally, postpartum depression can go away rather quickly, or it can be a long-term condition.  CAUSES Raised hormone levels and the rapid drop in those levels are thought to be a main cause of postpartum depression and the baby blues. A number of hormones change during and after pregnancy. Estrogen and progesterone usually decrease right after the delivery of your baby. The levels of thyroid hormone and various cortisol steroids also rapidly drop. Other factors that play a role in these mood changes include major life events and genetics.  RISK FACTORS If you have any of the following risks for the baby blues or postpartum depression, know what symptoms to watch out for during the postpartum period. Risk factors that may increase the likelihood of getting the baby blues or postpartum depression include:  Having a personal or family history of depression.   Having depression while being pregnant.   Having premenstrual mood issues or mood issues related to oral contraceptives.  Having a lot of life stress.   Having marital conflict.   Lacking a social support network.   Having a baby with special needs.   Having health problems, such as diabetes.  SIGNS AND SYMPTOMS Symptoms of baby blues  include:  Brief changes in mood, such as going from extreme happiness to sadness.  Decreased concentration.   Difficulty sleeping.   Crying spells, tearfulness.   Irritability.   Anxiety.  Symptoms of postpartum depression typically begin within the first month after giving birth. These symptoms include:  Difficulty sleeping or excessive sleepiness.   Marked weight loss.   Agitation.   Feelings of worthlessness.   Lack of interest in activity or food.  Postpartum psychosis is a very serious condition and can be dangerous. Fortunately, it is rare. Displaying any of the following symptoms is cause for immediate medical attention. Symptoms of postpartum psychosis include:   Hallucinations and delusions.   Bizarre or disorganized behavior.   Confusion or disorientation.  DIAGNOSIS  A diagnosis is made by an evaluation of your symptoms. There are no medical or lab tests that lead to a diagnosis, but there are various questionnaires that a health care provider may use to identify those with the baby blues, postpartum depression, or psychosis. Often, a screening tool called the Edinburgh Postnatal Depression Scale is used to diagnose depression in the postpartum period.  TREATMENT The baby blues usually goes away on its own in 1-2 weeks. Social support is often all that is needed. You will be encouraged to get adequate sleep and rest. Occasionally, you may be given medicines to help you sleep.  Postpartum depression requires treatment because it can last several months or longer if it is not treated. Treatment may include individual or group therapy, medicine, or both to address any social, physiological, and psychological   factors that may play a role in the depression. Regular exercise, a healthy diet, rest, and social support may also be strongly recommended.  Postpartum psychosis is more serious and needs treatment right away. Hospitalization is often needed. HOME CARE  INSTRUCTIONS  Get as much rest as you can. Nap when the baby sleeps.   Exercise regularly. Some women find yoga and walking to be beneficial.   Eat a balanced and nourishing diet.   Do little things that you enjoy. Have a cup of tea, take a bubble bath, read your favorite magazine, or listen to your favorite music.  Avoid alcohol.   Ask for help with household chores, cooking, grocery shopping, or running errands as needed. Do not try to do everything.   Talk to people close to you about how you are feeling. Get support from your partner, family members, friends, or other new moms.  Try to stay positive in how you think. Think about the things you are grateful for.   Do not spend a lot of time alone.   Only take over-the-counter or prescription medicine as directed by your health care provider.  Keep all your postpartum appointments.   Let your health care provider know if you have any concerns.  SEEK MEDICAL CARE IF: You are having a reaction to or problems with your medicine. SEEK IMMEDIATE MEDICAL CARE IF:  You have suicidal feelings.   You think you may harm the baby or someone else. MAKE SURE YOU:  Understand these instructions.  Will watch your condition.  Will get help right away if you are not doing well or get worse. Document Released: 09/17/2004 Document Revised: 12/19/2013 Document Reviewed: 09/25/2013 ExitCare Patient Information 2015 [employer]. This information is not intended to replace advice given to you by your health care provider. Make sure you discuss any questions you have with your health care provider.  

## 2015-01-07 NOTE — Lactation Note (Addendum)
This note was copied from the chart of Diamond Vinaya Viereck. Lactation Consultation Note Mom called for LC to assess baby's latch and suckle. States feels like baby is chomping instead of sucking. Mom doesn't feel like she is getting much because she is cluster feeding. Educated about newborn behavior. Assessed baby's tongue and suckle w/gloved finger. Baby will have un-coordinate sucks at times and not obtaining a good seal at the corners of her mouth. Then she will do good. I explained to mom that the baby may just need suck training. Unable to really assess the tongue well. Mom nipples are everted but has raised areas to ends of nipples. Mom stated they are not normally that way. Nipple shield fitted #20 and baby latched well, mom stated it felt much better. Noted when mom pulled her breast out of her bra, nipple was pressed in, came out well with stimulation. Gave nipple shells as well. Did note the baby appeared to do chomping motion on the breast, mom stated she didn't feel sucking, more like biting. Baby finally started pulling and sucking at the breast. Hand expressed colostrum and gave 7ml to baby w/syring and gloved finger. Noted baby sucked well, but didn't have a good seal around finger. Discussed positioning.  Patient Name: Diamond Dunn ZOXWR'UToday's Date: 01/07/2015 Reason for consult: Follow-up assessment   Maternal Data    Feeding Feeding Type: Breast Fed Length of feed: 15 min  LATCH Score/Interventions Latch: Grasps breast easily, tongue down, lips flanged, rhythmical sucking. Intervention(s): Adjust position;Assist with latch;Breast massage;Breast compression  Audible Swallowing: A few with stimulation Intervention(s): Hand expression;Alternate breast massage  Type of Nipple: Everted at rest and after stimulation  Comfort (Breast/Nipple): Filling, red/small blisters or bruises, mild/mod discomfort  Problem noted: Mild/Moderate discomfort Interventions (Mild/moderate  discomfort): Comfort gels;Breast shields;Hand expression;Hand massage  Hold (Positioning): Assistance needed to correctly position infant at breast and maintain latch. Intervention(s): Position options;Support Pillows;Breastfeeding basics reviewed  LATCH Score: 7  Lactation Tools Discussed/Used Tools: Shells;Nipple Shields;Pump;Comfort gels Nipple shield size: 20;24 Shell Type: Inverted Breast pump type: Manual Pump Review: Setup, frequency, and cleaning Initiated by:: Peri JeffersonL. Yechezkel Fertig Rn Date initiated:: 01/07/15   Consult Status Consult Status: Follow-up Follow-up type: In-patient    [name], [name] 01/07/2015, 1:59 AM

## 2016-03-03 IMAGING — US US OB COMP LESS 14 WK
1 series · 14 of 27 positions shown · non-contrast
Comparison: None.

CLINICAL DATA: Pregnant, bleeding

EXAM:
OBSTETRIC <14 WK ULTRASOUND
TECHNIQUE: Transabdominal ultrasound was performed for evaluation of the
gestation as well as the maternal uterus and adnexal regions.

[Series 1: us ob transvaginal · 14 of 27 slices shown]
[im 1/27]
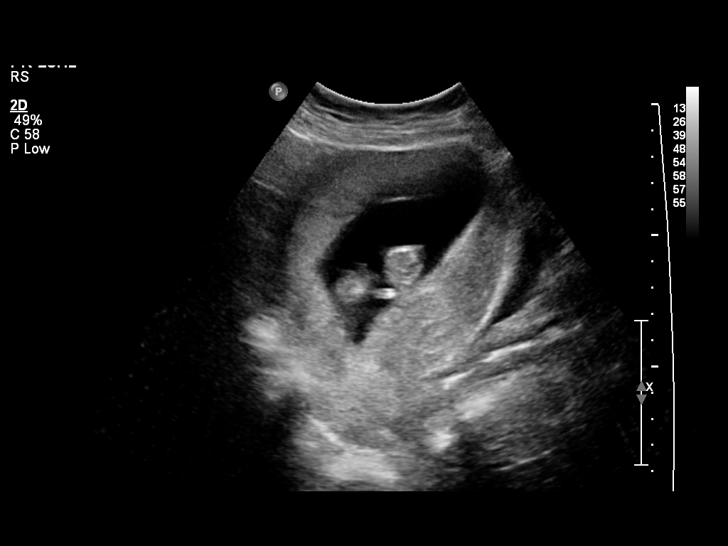
[im 3/27]
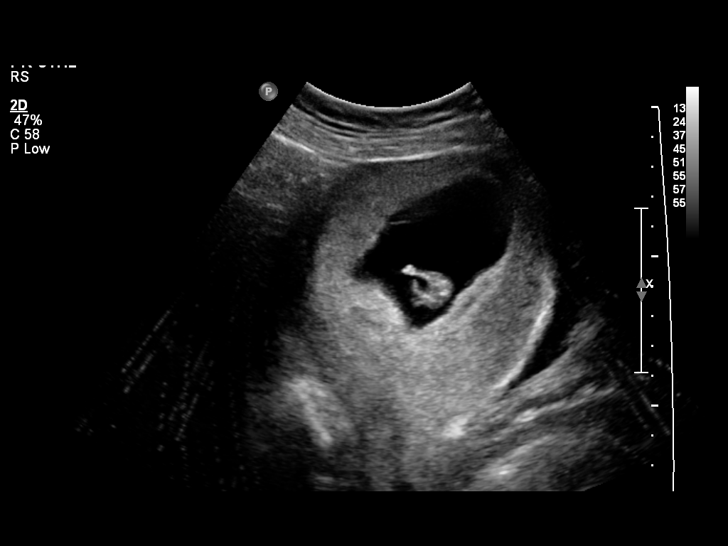
[im 5/27]
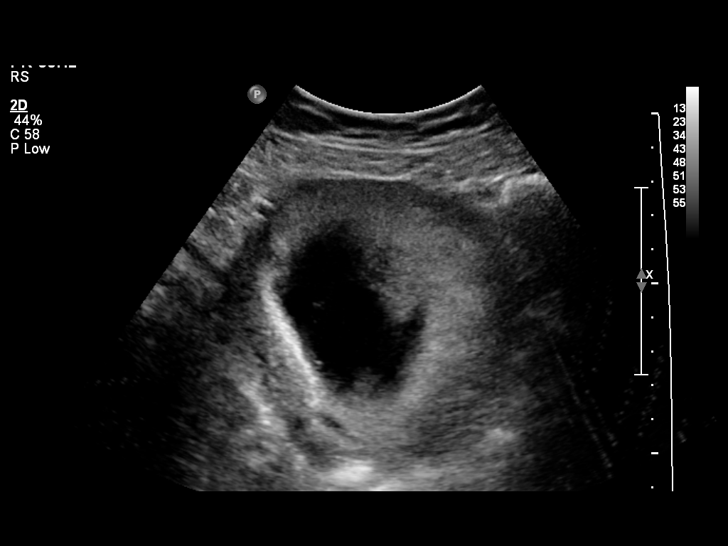
[im 7/27]
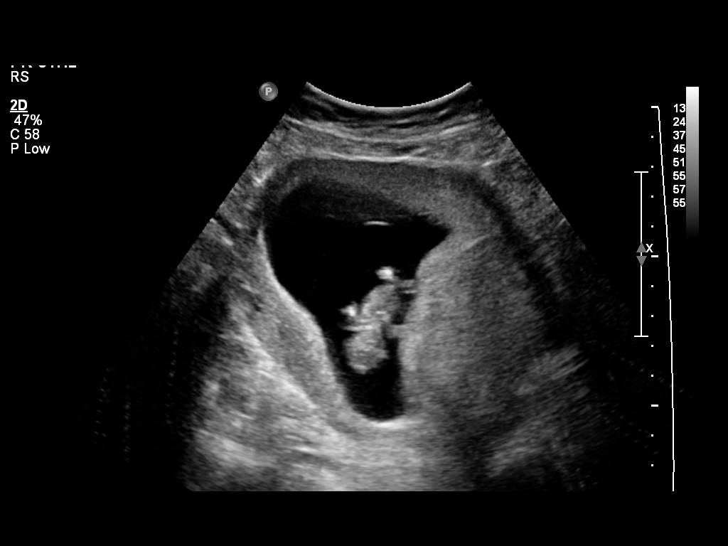
[im 9/27]
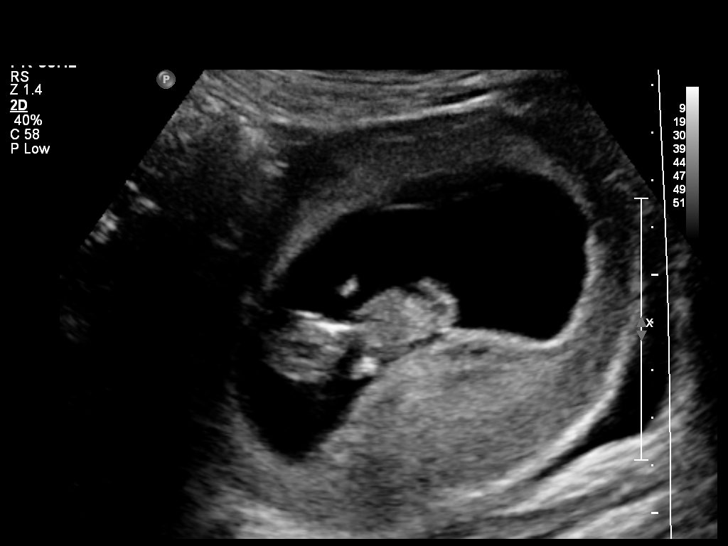
[im 11/27]
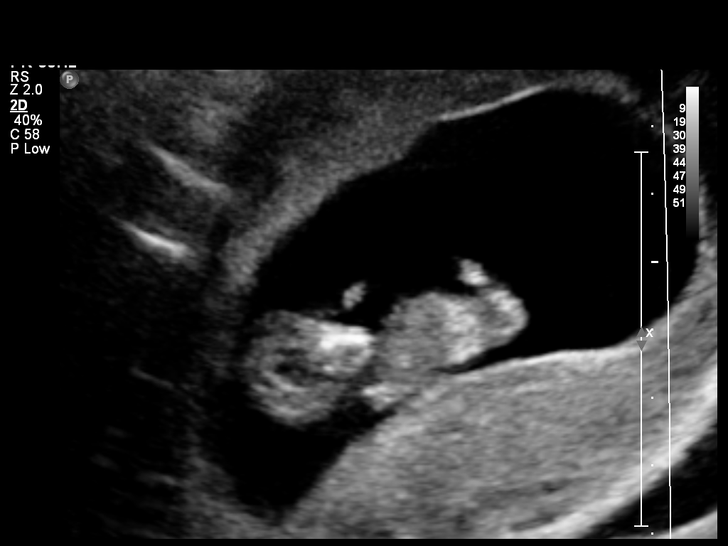
[im 13/27]
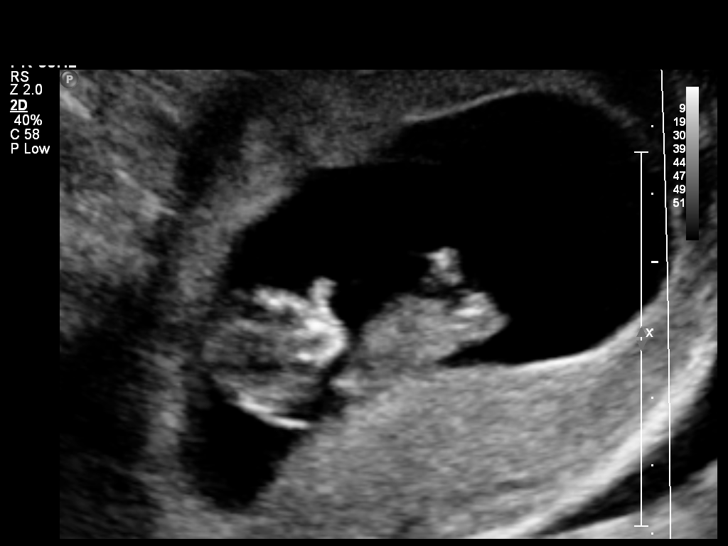
[im 15/27]
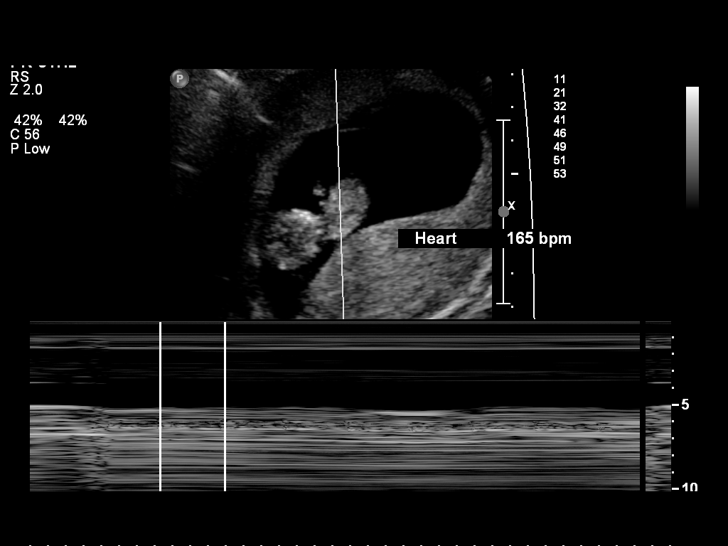
[im 17/27]
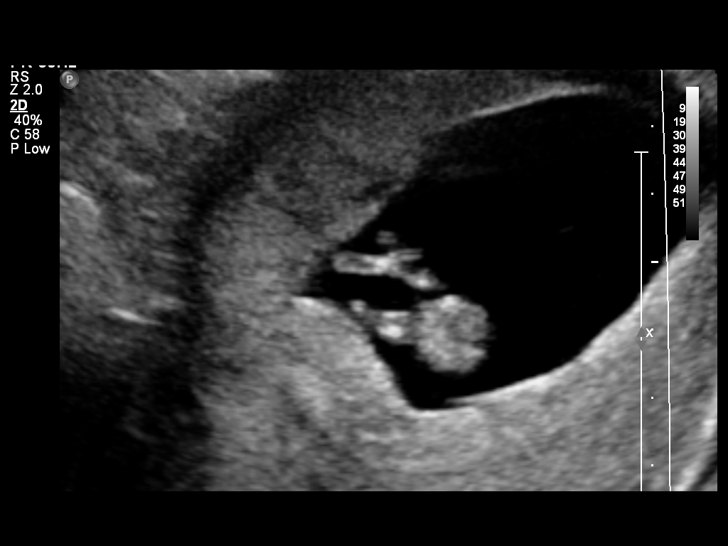
[im 19/27]
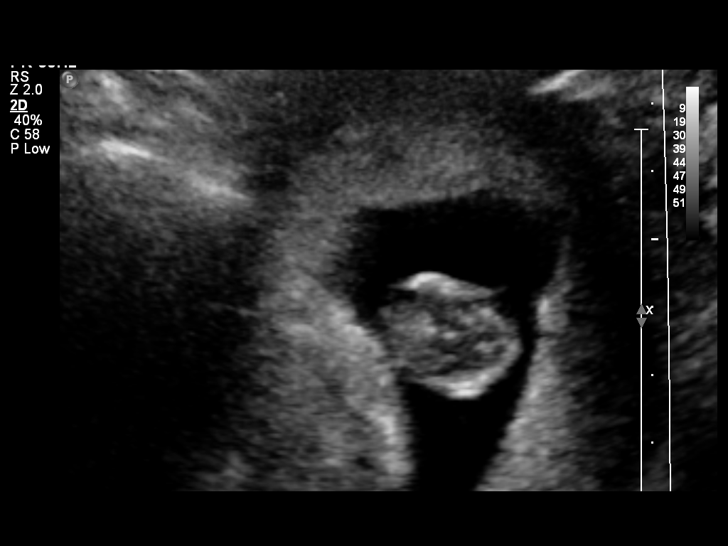
[im 21/27]
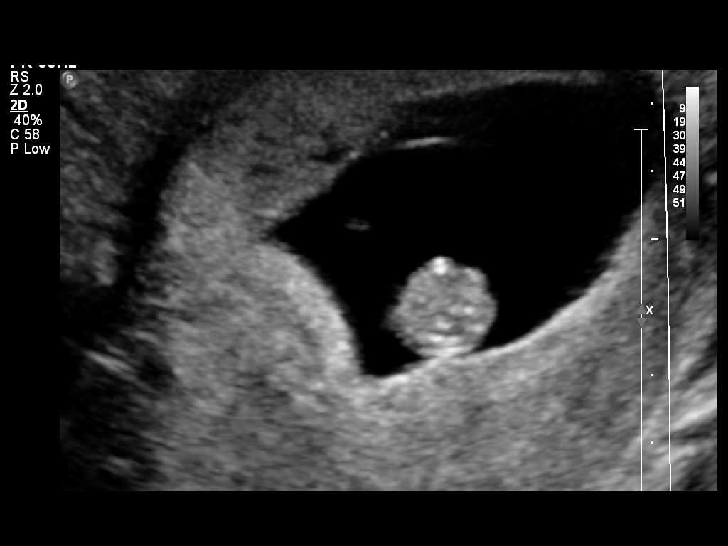
[im 23/27]
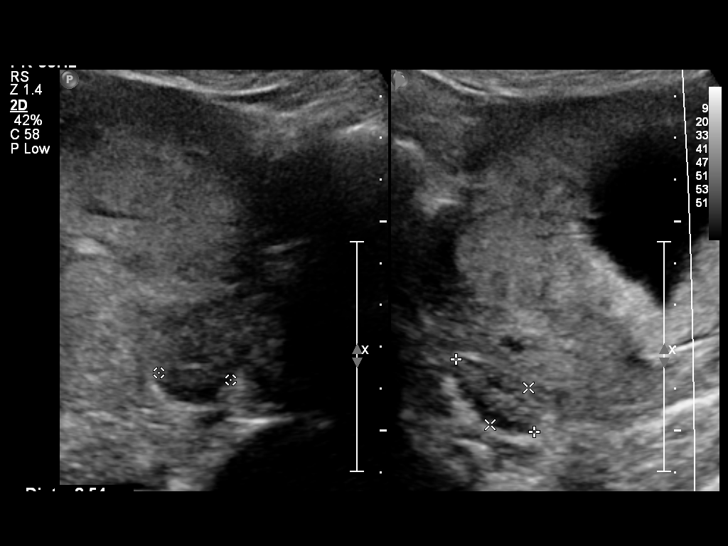
[im 25/27]
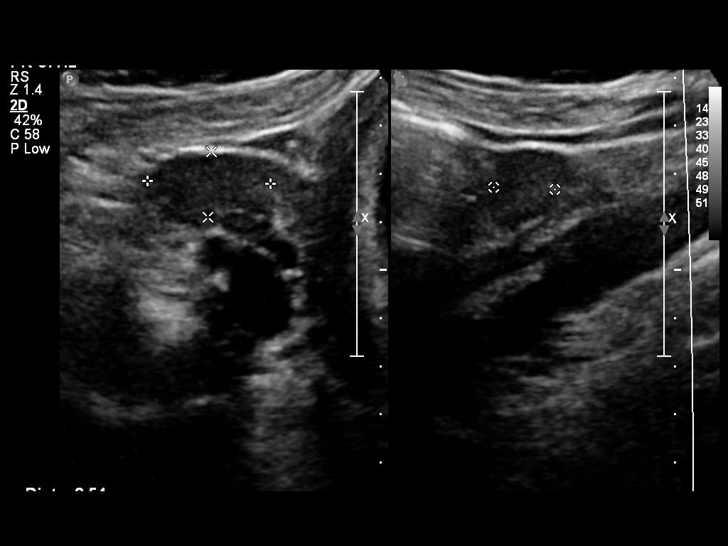
[im 27/27]
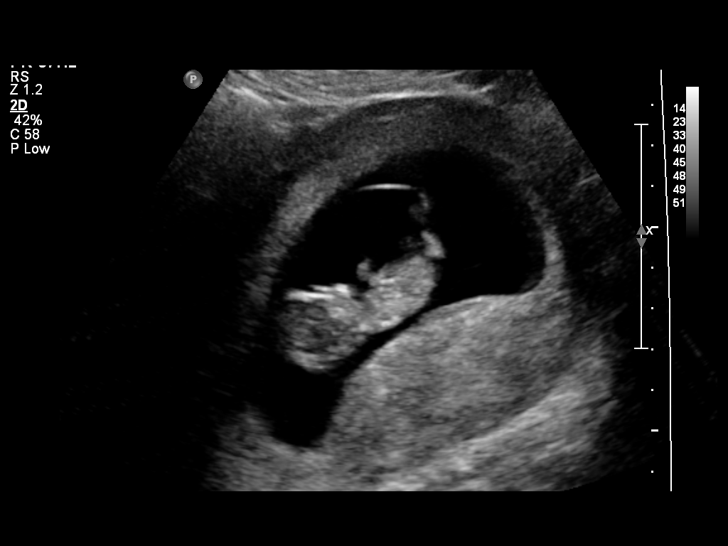

[14 of 27 positions shown; findings below may reference images not displayed]

FINDINGS: Intrauterine gestational sac: Visualized/normal in shape.

Yolk sac:  Present

Embryo:  Present

Cardiac Activity: Present

Heart Rate: 165 bpm

CRL:   43  mm   11 w 2 d                  US EDC: 12/28/2014

Maternal uterus/adnexae: No subchorionic hemorrhage.

Right ovary is within normal limits, measuring 2.5 x 1.4 x 1.3 cm.

Left ovary is within normal limits, measuring 2.5 x 1.3 x 1.7 cm.

No free fluid.
IMPRESSION: Single live intrauterine gestation with estimated gestational age 11
weeks 2 days by crown-rump length.

## 2017-06-03 LAB — OB RESULTS CONSOLE ABO/RH: RH TYPE: POSITIVE

## 2017-06-03 LAB — OB RESULTS CONSOLE HIV ANTIBODY (ROUTINE TESTING): HIV: NONREACTIVE

## 2017-06-03 LAB — OB RESULTS CONSOLE HEPATITIS B SURFACE ANTIGEN: HEP B S AG: NEGATIVE

## 2017-06-03 LAB — OB RESULTS CONSOLE GC/CHLAMYDIA
Chlamydia: NEGATIVE
Gonorrhea: NEGATIVE

## 2017-06-03 LAB — OB RESULTS CONSOLE RUBELLA ANTIBODY, IGM: RUBELLA: IMMUNE

## 2017-06-03 LAB — OB RESULTS CONSOLE ANTIBODY SCREEN: ANTIBODY SCREEN: NEGATIVE

## 2017-06-03 LAB — OB RESULTS CONSOLE RPR: RPR: NONREACTIVE

## 2017-06-26 ENCOUNTER — Inpatient Hospital Stay (HOSPITAL_COMMUNITY)
Admission: AD | Admit: 2017-06-26 | Discharge: 2017-06-27 | Disposition: A | Discharge status: Home / Self Care | Payer: 59 | Source: Ambulatory Visit | Attending: Obstetrics and Gynecology | Admitting: Obstetrics and Gynecology

## 2017-06-26 ENCOUNTER — Encounter (HOSPITAL_COMMUNITY): Payer: Self-pay | Admitting: *Deleted

## 2017-06-26 DIAGNOSIS — O99211 Obesity complicating pregnancy, first trimester: Secondary | ICD-10-CM | POA: Insufficient documentation

## 2017-06-26 DIAGNOSIS — K611 Rectal abscess: Secondary | ICD-10-CM

## 2017-06-26 DIAGNOSIS — Z3A11 11 weeks gestation of pregnancy: Secondary | ICD-10-CM | POA: Diagnosis not present

## 2017-06-26 DIAGNOSIS — I8390 Asymptomatic varicose veins of unspecified lower extremity: Secondary | ICD-10-CM

## 2017-06-26 DIAGNOSIS — O4290 Premature rupture of membranes, unspecified as to length of time between rupture and onset of labor, unspecified weeks of gestation: Secondary | ICD-10-CM

## 2017-06-26 DIAGNOSIS — O99210 Obesity complicating pregnancy, unspecified trimester: Secondary | ICD-10-CM

## 2017-06-26 DIAGNOSIS — N898 Other specified noninflammatory disorders of vagina: Secondary | ICD-10-CM | POA: Insufficient documentation

## 2017-06-26 DIAGNOSIS — O09299 Supervision of pregnancy with other poor reproductive or obstetric history, unspecified trimester: Secondary | ICD-10-CM

## 2017-06-26 DIAGNOSIS — O09521 Supervision of elderly multigravida, first trimester: Secondary | ICD-10-CM | POA: Diagnosis not present

## 2017-06-26 DIAGNOSIS — E669 Obesity, unspecified: Secondary | ICD-10-CM | POA: Diagnosis not present

## 2017-06-26 LAB — URINALYSIS, ROUTINE W REFLEX MICROSCOPIC
Bacteria, UA: NONE SEEN
Bilirubin Urine: NEGATIVE
GLUCOSE, UA: NEGATIVE mg/dL
Ketones, ur: NEGATIVE mg/dL
Leukocytes, UA: NEGATIVE
Nitrite: NEGATIVE
PROTEIN: NEGATIVE mg/dL
Specific Gravity, Urine: 1.004 — ABNORMAL LOW (ref 1.005–1.030)
Squamous Epithelial / LPF: NONE SEEN
pH: 6 (ref 5.0–8.0)

## 2017-06-26 NOTE — Progress Notes (Signed)
Wet prep sent.   

## 2017-06-26 NOTE — Progress Notes (Signed)
Kimberly Williams CNM on unit and aware of pt's admission and status 

## 2017-06-26 NOTE — MAU Note (Signed)
I think I am leaking amniotic fld for last . Fld is brownish/yellow and "sticky". No pain

## 2017-06-27 DIAGNOSIS — O99210 Obesity complicating pregnancy, unspecified trimester: Secondary | ICD-10-CM

## 2017-06-27 DIAGNOSIS — O09299 Supervision of pregnancy with other poor reproductive or obstetric history, unspecified trimester: Secondary | ICD-10-CM

## 2017-06-27 DIAGNOSIS — I8390 Asymptomatic varicose veins of unspecified lower extremity: Secondary | ICD-10-CM

## 2017-06-27 DIAGNOSIS — K611 Rectal abscess: Secondary | ICD-10-CM

## 2017-06-27 DIAGNOSIS — O09521 Supervision of elderly multigravida, first trimester: Secondary | ICD-10-CM

## 2017-06-27 LAB — WET PREP, GENITAL
Clue Cells Wet Prep HPF POC: NONE SEEN
Sperm: NONE SEEN
Trich, Wet Prep: NONE SEEN
Yeast Wet Prep HPF POC: NONE SEEN

## 2017-06-27 NOTE — Discharge Instructions (Signed)
Subchorionic Hematoma °A subchorionic hematoma is a gathering of blood between the outer wall of the placenta and the inner wall of the womb (uterus). The placenta is the organ that connects the fetus to the wall of the uterus. The placenta performs the feeding, breathing (oxygen to the fetus), and waste removal (excretory work) of the fetus. °Subchorionic hematoma is the most common abnormality found on a result from ultrasonography done during the first trimester or early second trimester of pregnancy. If there has been little or no vaginal bleeding, early small hematomas usually shrink on their own and do not affect your baby or pregnancy. The blood is gradually absorbed over 1-2 weeks. When bleeding starts later in pregnancy or the hematoma is larger or occurs in an older pregnant woman, the outcome may not be as good. Larger hematomas may get bigger, which increases the chances for miscarriage. Subchorionic hematoma also increases the risk of premature detachment of the placenta from the uterus, preterm (premature) labor, and stillbirth. °Follow these instructions at home: °· Stay on bed rest if your health care provider recommends this. Although bed rest will not prevent more bleeding or prevent a miscarriage, your health care provider may recommend bed rest until you are advised otherwise. °· Avoid heavy lifting (more than 10 lb [4.5 kg]), exercise, sexual intercourse, or douching as directed by your health care provider. °· Keep track of the number of pads you use each day and how soaked (saturated) they are. Write down this information. °· Do not use tampons. °· Keep all follow-up appointments as directed by your health care provider. Your health care provider may ask you to have follow-up blood tests or ultrasound tests or both. °Get help right away if: °· You have severe cramps in your stomach, back, abdomen, or pelvis. °· You have a fever. °· You pass large clots or tissue. Save any tissue for your  health care provider to look at. °· Your bleeding increases or you become lightheaded, feel weak, or have fainting episodes. °This information is not intended to replace advice given to you by your health care provider. Make sure you discuss any questions you have with your health care provider. °Document Released: 03/31/2007 Document Revised: 05/21/2016 Document Reviewed: 07/13/2013 °Elsevier Interactive Patient Education © 2017 Elsevier Inc. ° °

## 2017-06-27 NOTE — MAU Provider Note (Signed)
History  Diamond Dunn is a 36 yo G4P3003 @ 11.3 wks who presents to MAU w/ c/o leaking a gush of clear amniotic fluid (not urine) while getting ready for bed. On the phone she stated she was "losing fluid" that was yellowish/brownish in color. Denies pain or VB.  Admits to urinating when sneezing. Does not engage in Kegel exercises.  Patient Active Problem List   Diagnosis Date Noted  . AMA (advanced maternal age) multigravida 35+, first trimester 06/27/2017  . Obesity affecting pregnancy 06/27/2017  . H/O maternal third degree perineal laceration, currently pregnant 06/27/2017  . 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 Perirectal abscess (after 3rd degree) 06/27/2017    Chief Complaint  Patient presents with  . Vaginal Discharge   HPI  As above  OB History    Gravida Para Term Preterm AB Living   4 3 3  0 0 3   SAB TAB Ectopic Multiple Live Births   0 0 0 0 3      Past Medical History:  Diagnosis Date  . Pregnancy induced hypertension    on labetalol since 37wks    Past Surgical History:  Procedure Laterality Date  . NO PAST SURGERIES    . VARICOSE VEIN SURGERY    . WISDOM TOOTH EXTRACTION      Family History  Problem Relation Age of Onset  . Anesthesia problems Neg Hx   . Hypotension Neg Hx   . Malignant hyperthermia Neg Hx   . Pseudochol deficiency Neg Hx     Social History  Substance Use Topics  . Smoking status: Never Smoker  . Smokeless tobacco: Never Used  . Alcohol use No    Allergies: No Known Allergies  No prescriptions prior to admission.    ROS  LOF Physical Exam   Blood pressure 127/85, pulse 64, temperature 97.5 F (36.4 C), resp. rate 16, height 5\' 6"  (1.676 m), weight 84.4 kg (186 lb), last menstrual period 04/08/2017, unknown if currently breastfeeding.    Physical Exam  Gen: NAD. Abdomen: soft, NT, no rebound or guarding. NEFG. Speculum: Speck of dark red blood at cervix. No active bleeding, no odor. Cvx visually closed. Scant  amount of tan, sticky discharge noted. No fluid from cvx w/ valsalva. Wet prep obtained.  Ext: WNL. Doptones: 175 bpm.  Results for orders placed or performed during the hospital encounter of 06/26/17 (from the past 24 hour(s))  Urinalysis, Routine w reflex microscopic     Status: Abnormal   Collection Time: 06/26/17 11:20 PM  Result Value Ref Range   Color, Urine STRAW (A) YELLOW   APPearance CLEAR CLEAR   Specific Gravity, Urine 1.004 (L) 1.005 - 1.030   pH 6.0 5.0 - 8.0   Glucose, UA NEGATIVE NEGATIVE mg/dL   Hgb urine dipstick SMALL (A) NEGATIVE   Bilirubin Urine NEGATIVE NEGATIVE   Ketones, ur NEGATIVE NEGATIVE mg/dL   Protein, ur NEGATIVE NEGATIVE mg/dL   Nitrite NEGATIVE NEGATIVE   Leukocytes, UA NEGATIVE NEGATIVE   RBC / HPF 0-5 0 - 5 RBC/hpf   WBC, UA 0-5 0 - 5 WBC/hpf   Bacteria, UA NONE SEEN NONE SEEN   Squamous Epithelial / LPF NONE SEEN NONE SEEN  Wet prep, genital     Status: Abnormal   Collection Time: 06/26/17 11:55 PM  Result Value Ref Range   Yeast Wet Prep HPF POC NONE SEEN NONE SEEN   Trich, Wet Prep NONE SEEN NONE SEEN   Clue Cells Wet Prep HPF POC NONE SEEN  NONE SEEN   WBC, Wet Prep HPF POC MANY (A) NONE SEEN   Sperm NONE SEEN       ED Course  UA Urine culture Wet prep  Assessment: Leukorrhea No concerns for infection at present Suspect urinary incontinence +doptones H[facility]O [facility] A[facility]  Plan: Advised pelvic rest, no strenuous exercise and no heavy lifting. Explained importance of keeping all u/s and ROB appts. Reassurance. Strict SAB precautions. Kegel exercises encouraged. D/C'd home in stable condition. To be seen sooner if condition warrants.    Sherre ScarletWILLIAMS, Rosevelt Luu CNM, MS 06/27/2017 5:24 AM

## 2017-06-27 NOTE — Progress Notes (Signed)
Diamond ScarletKimberly Dunn CNM in earlier to discuss test result and d/c plan. Written and verbal d/c instructions given and understanding voiced.

## 2017-06-28 LAB — CULTURE, OB URINE: CULTURE: NO GROWTH

## 2017-12-16 ENCOUNTER — Encounter (HOSPITAL_COMMUNITY): Payer: Self-pay

## 2017-12-16 ENCOUNTER — Other Ambulatory Visit: Payer: Self-pay | Admitting: Nurse Practitioner

## 2017-12-16 ENCOUNTER — Inpatient Hospital Stay (HOSPITAL_COMMUNITY)
Admission: AD | Admit: 2017-12-16 | Discharge: 2017-12-16 | Disposition: A | Discharge status: Home / Self Care | Payer: 59 | Source: Ambulatory Visit | Attending: Obstetrics & Gynecology | Admitting: Obstetrics & Gynecology

## 2017-12-16 DIAGNOSIS — O133 Gestational [pregnancy-induced] hypertension without significant proteinuria, third trimester: Secondary | ICD-10-CM | POA: Insufficient documentation

## 2017-12-16 DIAGNOSIS — Z3A36 36 weeks gestation of pregnancy: Secondary | ICD-10-CM | POA: Insufficient documentation

## 2017-12-16 LAB — COMPREHENSIVE METABOLIC PANEL
ALK PHOS: 92 U/L (ref 38–126)
ALT: 14 U/L (ref 14–54)
AST: 20 U/L (ref 15–41)
Albumin: 2.8 g/dL — ABNORMAL LOW (ref 3.5–5.0)
Anion gap: 10 (ref 5–15)
BILIRUBIN TOTAL: 0.3 mg/dL (ref 0.3–1.2)
BUN: 10 mg/dL (ref 6–20)
CO2: 21 mmol/L — ABNORMAL LOW (ref 22–32)
CREATININE: 0.78 mg/dL (ref 0.44–1.00)
Calcium: 8.9 mg/dL (ref 8.9–10.3)
Chloride: 105 mmol/L (ref 101–111)
GFR calc Af Amer: >60 mL/min (ref >60)
Glucose, Bld: 117 mg/dL — ABNORMAL HIGH (ref 65–99)
Potassium: 3.7 mmol/L (ref 3.5–5.1)
Sodium: 136 mmol/L (ref 135–145)
TOTAL PROTEIN: 6.4 g/dL — AB (ref 6.5–8.1)

## 2017-12-16 LAB — CBC
HEMATOCRIT: 38 % (ref 36.0–46.0)
HEMOGLOBIN: 12.8 g/dL (ref 12.0–15.0)
MCH: 29.9 pg (ref 26.0–34.0)
MCHC: 33.7 g/dL (ref 30.0–36.0)
MCV: 88.8 fL (ref 78.0–100.0)
PLATELETS: 182 10*3/uL (ref 150–400)
RBC: 4.28 MIL/uL (ref 3.87–5.11)
RDW: 14.1 % (ref 11.5–15.5)
WBC: 10.7 10*3/uL — AB (ref 4.0–10.5)

## 2017-12-16 LAB — PROTEIN / CREATININE RATIO, URINE
CREATININE, URINE: 25 mg/dL
Total Protein, Urine: <6 mg/dL

## 2017-12-16 LAB — OB RESULTS CONSOLE GBS: STREP GROUP B AG: POSITIVE

## 2017-12-16 LAB — URIC ACID: Uric Acid, Serum: 6.7 mg/dL — ABNORMAL HIGH (ref 2.3–6.6)

## 2017-12-16 MED ORDER — LABETALOL HCL 100 MG PO TABS
100.0000 mg | ORAL_TABLET | Freq: Once | ORAL | Status: AC
  Administered 2017-12-16: 100 mg via ORAL
  Filled 2017-12-16: qty 1

## 2017-12-16 MED ORDER — LABETALOL HCL 100 MG PO TABS: 100.0000 mg | ORAL_TABLET | Freq: Two times a day (BID) | ORAL | 1 refills | Status: DC

## 2017-12-16 NOTE — MAU Note (Signed)
Pt sent from office for HTN evaluation

## 2017-12-16 NOTE — Discharge Instructions (Signed)
Get medication from your pharmacy and take twice a day/ Come to MAU on Sunday for NST and BP evaluation Plan to come to the office on 12-23-17 for next OB visit. Return sooner to MAU if having headaches, blurred vision, RUQ pain or severe edema.

## 2017-12-16 NOTE — MAU Provider Note (Signed)
History     CSN: [id number]  Arrival date and time: 12/16/17 1430   First Provider Initiated Contact with Patient 12/16/17 1452      Chief Complaint  Patient presents with  . Hypertension   HPI Diamond Dunn 36 y.o. 7559w0d  Sent over from the office for monitoring and labs to evaluate for preeclampsia.  Has noticed fluid retained in hands - worse this pregnancy.  Has had elevations in blood pressure in previous pregnancies at 37 weeks but not this early.  Denies any headaches, visual changes or RUQ pain.  Today BP was elevated in the office.    OB History    Gravida Para Term Preterm AB Living   4 3 3  0 0 3   SAB TAB Ectopic Multiple Live Births   0 0 0 0 3      Past Medical History:  Diagnosis Date  . Pregnancy induced hypertension    on labetalol since 37wks    Past Surgical History:  Procedure Laterality Date  . NO PAST SURGERIES    . VARICOSE VEIN SURGERY    . WISDOM TOOTH EXTRACTION      Family History  Problem Relation Age of Onset  . Anesthesia problems Neg Hx   . Hypotension Neg Hx   . Malignant hyperthermia Neg Hx   . Pseudochol deficiency Neg Hx     Social History   Tobacco Use  . Smoking status: Never Smoker  . Smokeless tobacco: Never Used  Substance Use Topics  . Alcohol use: No  . Drug use: No    Allergies:  Allergies  Allergen Reactions  . Miconazole Rash    Pt got a rash after using but not sure if the rash was caused by monistat    Medications Prior to Admission  Medication Sig Dispense Refill Last Dose  . calcium carbonate (TUMS - DOSED IN MG ELEMENTAL CALCIUM) 500 MG chewable tablet Chew 1-2 tablets by mouth daily as needed for indigestion or heartburn.   Past Week at Unknown time  . Menthol (COUGH DROPS) 10 MG LOZG Use as directed 1 lozenge in the mouth or throat every 4 (four) hours as needed (cough).   12/16/2017 at Unknown time  . Prenatal Vit-Fe Fumarate-FA (PRENATAL MULTIVITAMIN) TABS Take 1 tablet by mouth daily.     12/15/2017 at Unknown time    Review of Systems  Constitutional: Negative for fever.  Gastrointestinal: Negative for abdominal pain.  Genitourinary: Negative for vaginal bleeding and vaginal discharge.       Baby is moving well No leaking of fluid.  Musculoskeletal:       Edema in hands  Neurological: Negative for headaches.       No visual changes   Physical Exam   Blood pressure 124/76, pulse 91, temperature 97.6 F (36.4 C), resp. rate 16, last menstrual period 04/08/2017, unknown if currently breastfeeding.  Physical Exam  Nursing note and vitals reviewed. Constitutional: She is oriented to person, place, and time. She appears well-developed and well-nourished.  HENT:  Head: Normocephalic.  Eyes: EOM are normal.  Neck: Neck supple.  GI: Soft. There is no tenderness. There is no rebound and no guarding.  FHT baseline is 140 with moderate variability, accels of 15x15 noted  - reactive strip, occasional contraction, no decelerations.  Musculoskeletal: Normal range of motion.  No ankle edema bilaterally Fingers appear tight but able to make a fist slowly  Neurological: She is alert and oriented to person, place, and time.  Skin:  Skin is warm and dry.  Psychiatric: She has a normal mood and affect.    MAU Course  Procedures Results for orders placed or performed during the hospital encounter of 12/16/17 (from the past 24 hour(s))  Protein / creatinine ratio, urine     Status: None   Collection Time: 12/16/17  2:32 PM  Result Value Ref Range   Creatinine, Urine 25.00 mg/dL   Total Protein, Urine <6 mg/dL   Protein Creatinine Ratio        0.00 - 0.15 mg/mg[Cre]  CBC     Status: Abnormal   Collection Time: 12/16/17  3:09 PM  Result Value Ref Range   WBC 10.7 (H) 4.0 - 10.5 K/uL   RBC 4.28 3.87 - 5.11 MIL/uL   Hemoglobin 12.8 12.0 - 15.0 g/dL   HCT 04.538.0 40.936.0 - 81.146.0 %   MCV 88.8 78.0 - 100.0 fL   MCH 29.9 26.0 - 34.0 pg   MCHC 33.7 30.0 - 36.0 g/dL   RDW 91.414.1 78.211.5 -  95.615.5 %   Platelets 182 150 - 400 K/uL  Comprehensive metabolic panel     Status: Abnormal   Collection Time: 12/16/17  3:09 PM  Result Value Ref Range   Sodium 136 135 - 145 mmol/L   Potassium 3.7 3.5 - 5.1 mmol/L   Chloride 105 101 - 111 mmol/L   CO2 21 (L) 22 - 32 mmol/L   Glucose, Bld 117 (H) 65 - 99 mg/dL   BUN 10 6 - 20 mg/dL   Creatinine, Ser 2.130.78 0.44 - 1.00 mg/dL   Calcium 8.9 8.9 - 08.610.3 mg/dL   Total Protein 6.4 (L) 6.5 - 8.1 g/dL   Albumin 2.8 (L) 3.5 - 5.0 g/dL   AST 20 15 - 41 U/L   ALT 14 14 - 54 U/L   Alkaline Phosphatase 92 38 - 126 U/L   Total Bilirubin 0.3 0.3 - 1.2 mg/dL   GFR calc non Af Amer >60 >60 mL/min   GFR calc Af Amer >60 >60 mL/min   Anion gap 10 5 - 15  Uric acid     Status: Abnormal   Collection Time: 12/16/17  3:09 PM  Result Value Ref Range   Uric Acid, Serum 6.7 (H) 2.3 - 6.6 mg/dL    MDM Will get labs to evaluate for preeclampsia - office called saying client was coming and needed labs.  Serial BPs will be taken.  Currently diastolic is elevated.  Dr. Sallye OberKulwa in surgery.  1620  Labs back and reviewed.  Message left for Dr. Sallye OberKulwa to call MAU.  Serial BPs continue to be elevated - not in the severe range.  1630  Reviewed blood pressures, lab results and plan of care with Dr. Sallye OberKulwa by phone.  Assessment and Plan  Gestational hypertension at 1670w0d  Plan Dr. Sallye OberKulwa to see client in MAU before she leaves. Will prescribe labetalol 100 mg PO BID - will prescribe one dose to be given in MAU. Will come to MAU on Sunday for NST and BP evaluation Plan to come to the office on 12-23-17 for next OB visit. Return sooner to MAU if having headaches, blurred vision, RUQ pain or severe edema.  Diamond Dunn 12/16/2017, 4:33 PM

## 2017-12-19 ENCOUNTER — Inpatient Hospital Stay (HOSPITAL_COMMUNITY)
Admission: AD | Admit: 2017-12-19 | Discharge: 2017-12-19 | Disposition: A | Discharge status: Home / Self Care | Payer: 59 | Source: Ambulatory Visit | Attending: Obstetrics and Gynecology | Admitting: Obstetrics and Gynecology

## 2017-12-19 DIAGNOSIS — Z79899 Other long term (current) drug therapy: Secondary | ICD-10-CM | POA: Diagnosis not present

## 2017-12-19 DIAGNOSIS — O139 Gestational [pregnancy-induced] hypertension without significant proteinuria, unspecified trimester: Secondary | ICD-10-CM

## 2017-12-19 DIAGNOSIS — Z3A36 36 weeks gestation of pregnancy: Secondary | ICD-10-CM | POA: Diagnosis not present

## 2017-12-19 DIAGNOSIS — O133 Gestational [pregnancy-induced] hypertension without significant proteinuria, third trimester: Secondary | ICD-10-CM | POA: Diagnosis not present

## 2017-12-19 LAB — URINALYSIS, ROUTINE W REFLEX MICROSCOPIC
Bilirubin Urine: NEGATIVE
Glucose, UA: NEGATIVE mg/dL
Hgb urine dipstick: NEGATIVE
Ketones, ur: NEGATIVE mg/dL
Leukocytes, UA: NEGATIVE
Nitrite: NEGATIVE
Protein, ur: NEGATIVE mg/dL
Specific Gravity, Urine: 1.015 (ref 1.005–1.030)
pH: 6 (ref 5.0–8.0)

## 2017-12-19 NOTE — Discharge Instructions (Signed)
Hypertension During Pregnancy °Hypertension, commonly called high blood pressure, is when the force of blood pumping through your arteries is too strong. Arteries are blood vessels that carry blood from the heart throughout the body. Hypertension during pregnancy can cause problems for you and your baby. Your baby may be born early (prematurely) or may not weigh as much as he or she should at birth. Very bad cases of hypertension during pregnancy can be life-threatening. °Different types of hypertension can occur during pregnancy. These include: °· Chronic hypertension. This happens when: °? You have hypertension before pregnancy and it continues during pregnancy. °? You develop hypertension before you are [redacted] weeks pregnant, and it continues during pregnancy. °· Gestational hypertension. This is hypertension that develops after the 20th week of pregnancy. °· Preeclampsia, also called toxemia of pregnancy. This is a very serious type of hypertension that develops only during pregnancy. It affects the whole body, and it can be very dangerous for you and your baby. ° °Gestational hypertension and preeclampsia usually go away within 6 weeks after your baby is born. Women who have hypertension during pregnancy have a greater chance of developing hypertension later in life or during future pregnancies. °What are the causes? °The exact cause of hypertension is not known. °What increases the risk? °There are certain factors that make it more likely for you to develop hypertension during pregnancy. These include: °· Having hypertension during a previous pregnancy or prior to pregnancy. °· Being overweight. °· Being older than age 40. °· Being pregnant for the first time or being pregnant with more than one baby. °· Becoming pregnant using fertilization methods such as IVF (in vitro fertilization). °· Having diabetes, kidney problems, or systemic lupus erythematosus. °· Having a family history of hypertension. ° °What are the  signs or symptoms? °Chronic hypertension and gestational hypertension rarely cause symptoms. Preeclampsia causes symptoms, which may include: °· Increased protein in your urine. Your health care provider will check for this at every visit before you give birth (prenatal visit). °· Severe headaches. °· Sudden weight gain. °· Swelling of the hands, face, legs, and feet. °· Nausea and vomiting. °· Vision problems, such as blurred or double vision. °· Numbness in the face, arms, legs, and feet. °· Dizziness. °· Slurred speech. °· Sensitivity to bright lights. °· Abdominal pain. °· Convulsions. ° °How is this diagnosed? °You may be diagnosed with hypertension during a routine prenatal exam. At each prenatal visit, you may: °· Have a urine test to check for high amounts of protein in your urine. °· Have your blood pressure checked. A blood pressure reading is recorded as two numbers, such as "120 over 80" (or 120/80). The first ("top") number is called the systolic pressure. It is a measure of the pressure in your arteries when your heart beats. The second ("bottom") number is called the diastolic pressure. It is a measure of the pressure in your arteries as your heart relaxes between beats. Blood pressure is measured in a unit called mm Hg. A normal blood pressure reading is: °? Systolic: below 120. °? Diastolic: below 80. ° °The type of hypertension that you are diagnosed with depends on your test results and when your symptoms developed. °· Chronic hypertension is usually diagnosed before 20 weeks of pregnancy. °· Gestational hypertension is usually diagnosed after 20 weeks of pregnancy. °· Hypertension with high amounts of protein in the urine is diagnosed as preeclampsia. °· Blood pressure measurements that stay above 160 systolic, or above 110 diastolic, are   signs of severe preeclampsia. ° °How is this treated? °Treatment for hypertension during pregnancy varies depending on the type of hypertension you have and how  serious it is. °· If you take medicines called ACE inhibitors to treat chronic hypertension, you may need to switch medicines. ACE inhibitors should not be taken during pregnancy. °· If you have gestational hypertension, you may need to take blood pressure medicine. °· If you are at risk for preeclampsia, your health care provider may recommend that you take a low-dose aspirin every day to prevent high blood pressure during your pregnancy. °· If you have severe preeclampsia, you may need to be hospitalized so you and your baby can be monitored closely. You may also need to take medicine (magnesium sulfate) to prevent seizures and to lower blood pressure. This medicine may be given as an injection or through an IV tube. °· In some cases, if your condition gets worse, you may need to deliver your baby early. ° °Follow these instructions at home: °Eating and drinking °· Drink enough fluid to keep your urine clear or pale yellow. °· Eat a healthy diet that is low in salt (sodium). Do not add salt to your food. Check food labels to see how much sodium a food or beverage contains. °Lifestyle °· Do not use any products that contain nicotine or tobacco, such as cigarettes and e-cigarettes. If you need help quitting, ask your health care provider. °· Do not use alcohol. °· Avoid caffeine. °· Avoid stress as much as possible. Rest and get plenty of sleep. °General instructions °· Take over-the-counter and prescription medicines only as told by your health care provider. °· While lying down, lie on your left side. This keeps pressure off your baby. °· While sitting or lying down, raise (elevate) your feet. Try putting some pillows under your lower legs. °· Exercise regularly. Ask your health care provider what kinds of exercise are best for you. °· Keep all prenatal and follow-up visits as told by your health care provider. This is important. °Contact a health care provider if: °· You have symptoms that your health care  provider told you may require more treatment or monitoring, such as: °? Fever. °? Vomiting. °? Headache. °Get help right away if: °· You have severe abdominal pain or vomiting that does not get better with treatment. °· You suddenly develop swelling in your hands, ankles, or face. °· You gain 4 lbs (1.8 kg) or more in 1 week. °· You develop vaginal bleeding, or you have blood in your urine. °· You do not feel your baby moving as much as usual. °· You have blurred or double vision. °· You have muscle twitching or sudden tightening (spasms). °· You have shortness of breath. °· Your lips or fingernails turn blue. °This information is not intended to replace advice given to you by your health care provider. Make sure you discuss any questions you have with your health care provider. °Document Released: 09/01/2011 Document Revised: 07/03/2016 Document Reviewed: 05/29/2016 °Elsevier Interactive Patient Education © 2018 [employer] ° °

## 2017-12-19 NOTE — Progress Notes (Addendum)
G4P3 @ 36.[redacted] wksga. Here for NST and BP check. Denies LOF or bleeding. + FM.   EFM applied.   1159: provider made aware of the urine result. Ok for pt to go home.   1159: D/C instructions given with pt understanding. Pt left unit via ambulatory

## 2017-12-19 NOTE — MAU Note (Signed)
Chief Complaint:  No chief complaint on file.   None    HPI: Mikki HarborBrianne Esper is a 36 y.o. [phone] at 4031w3d who presents to maternity admissions reporting follow up NST and BP.  Pt was seen in MAU on Dec 20th for Pre eclamptic workup which was negative.  Pt was diagnosis with gestational hypertension and placed on Labetalol 100mg  BID.  Pt denies headache or blurred vision.  FM+ Prenatal hx remarkable for hx of gestational hypertension with prior pregnancies.   Denies contractions, leakage of fluid or vaginal bleeding. Good fetal movement.   Pregnancy Course:   Past Medical History:  Diagnosis Date  . Pregnancy induced hypertension    on labetalol since 37wks   OB History  Gravida Para Term Preterm AB Living  4 3 3  0 0 3  SAB TAB Ectopic Multiple Live Births  0 0 0 0 3    # Outcome Date GA Lbr Len/2nd Weight Sex Delivery Anes PTL Lv  4 Current           3 Term 01/05/15 3336w4d 13:15 / 00:09 3.955 kg (8 lb 11.5 oz) F Vag-Spont EPI  LIV  2 Term 07/09/13 4225w3d 07:56 / 00:15 4.031 kg (8 lb 14.2 oz) M Vag-Spont Local  LIV     Birth Comments: wnl  1 Term 01/08/12 6525w3d 11:38 / 00:14 3.033 kg (6 lb 11 oz) M Vag-Spont Local  LIV     Birth Comments: none      Past Surgical History:  Procedure Laterality Date  . NO PAST SURGERIES    . VARICOSE VEIN SURGERY    . WISDOM TOOTH EXTRACTION     Family History  Problem Relation Age of Onset  . Anesthesia problems Neg Hx   . Hypotension Neg Hx   . Malignant hyperthermia Neg Hx   . Pseudochol deficiency Neg Hx    Social History   Tobacco Use  . Smoking status: Never Smoker  . Smokeless tobacco: Never Used  Substance Use Topics  . Alcohol use: No  . Drug use: No   Allergies  Allergen Reactions  . Miconazole Rash    Pt got a rash after using but not sure if the rash was caused by monistat   Medications Prior to Admission  Medication Sig Dispense Refill Last Dose  . calcium carbonate (TUMS - DOSED IN MG ELEMENTAL CALCIUM) 500 MG  chewable tablet Chew 1-2 tablets by mouth daily as needed for indigestion or heartburn.   Past Week at Unknown time  . labetalol (NORMODYNE) 100 MG tablet TAKE 1 TABLET(100 MG) BY MOUTH TWICE DAILY FOR 15 DAYS 180 tablet 1   . Menthol (COUGH DROPS) 10 MG LOZG Use as directed 1 lozenge in the mouth or throat every 4 (four) hours as needed (cough).   12/16/2017 at Unknown time  . Prenatal Vit-Fe Fumarate-FA (PRENATAL MULTIVITAMIN) TABS Take 1 tablet by mouth daily.    12/15/2017 at Unknown time    I have reviewed patient's Past Medical Hx, Surgical Hx, Family Hx, Social Hx, medications and allergies.   ROS:  Review of Systems  All other systems reviewed and are negative.   Physical Exam   Patient Vitals for the past 24 hrs:  BP Pulse  12/19/17 1045 127/84 96   Constitutional: Well-developed, well-nourished female in no acute distress.  Cardiovascular: normal rate Respiratory: normal effort GI: Abd soft, non-tender, gravid appropriate for gestational age. Pos BS x 4 MS: Extremities nontender, no edema, normal ROM Neurologic: Alert and  oriented x 4.  FHT:  Baseline 130  , moderate variability, accelerations present, no decelerations Contractions: none   Labs: UA negative for protein.    Imaging:  No results found.  MAU Course: Orders Placed This Encounter  Procedures  . Fetal Kick Count:  Lie on our left side for one hour after a meal, and count the number of times your baby kicks.  If it is less than 5 times, get up, move around and drink some juice.  Repeat the test 30 minutes later.  If it is still less than 5 kicks in an hour, notify your doctor.  Marland Kitchen. Discharge activity:  Up to eat  . Discharge diet:  No restrictions  . Discharge patient Discharge disposition: 01-Home or Self Care; Discharge patient date: 12/19/2017   No orders of the defined types were placed in this encounter.   MDM: Reviewed signs and symptoms and VS.  BP controlled with labetalol, UA    NST reactive.    Assessment: 1. Gestational hypertension affecting fourth pregnancy     Plan: Discharge home in stable condition.  Pre eclampsia signs and symptoms. Labor precautions and fetal kick counts Follow-up Information    [facility] Follow up in 3 day(s).   Specialty:  Obstetrics and Gynecology Contact information: [address] [place] [phone]          Allergies as of 12/19/2017      Reactions   Miconazole Rash   Pt got a rash after using but not sure if the rash was caused by monistat      Medication List    TAKE these medications   calcium carbonate 500 MG chewable tablet Commonly known as:  TUMS - dosed in mg elemental calcium Chew 1-2 tablets by mouth daily as needed for indigestion or heartburn.   COUGH DROPS 10 MG Lozg Generic drug:  Menthol Use as directed 1 lozenge in the mouth or throat every 4 (four) hours as needed (cough).   labetalol 100 MG tablet Commonly known as:  NORMODYNE TAKE 1 TABLET(100 MG) BY MOUTH TWICE DAILY FOR 15 DAYS   prenatal multivitamin Tabs tablet Take 1 tablet by mouth daily.       Kenney Housemanrothero, Keneisha Heckart Jean, CNM 12/19/2017 11:23 AM

## 2017-12-26 ENCOUNTER — Inpatient Hospital Stay (HOSPITAL_COMMUNITY)
Admission: AD | Admit: 2017-12-26 | Discharge: 2017-12-26 | Disposition: A | Discharge status: Home / Self Care | Payer: 59 | Source: Ambulatory Visit | Attending: Obstetrics and Gynecology | Admitting: Obstetrics and Gynecology

## 2017-12-26 ENCOUNTER — Encounter (HOSPITAL_COMMUNITY): Payer: Self-pay

## 2017-12-26 DIAGNOSIS — O133 Gestational [pregnancy-induced] hypertension without significant proteinuria, third trimester: Secondary | ICD-10-CM | POA: Diagnosis not present

## 2017-12-26 DIAGNOSIS — Z888 Allergy status to other drugs, medicaments and biological substances status: Secondary | ICD-10-CM | POA: Diagnosis not present

## 2017-12-26 DIAGNOSIS — Z3A37 37 weeks gestation of pregnancy: Secondary | ICD-10-CM | POA: Diagnosis not present

## 2017-12-26 MED ORDER — LABETALOL HCL 100 MG PO TABS: 200.0000 mg | ORAL_TABLET | Freq: Two times a day (BID) | ORAL | Status: DC

## 2017-12-26 MED ORDER — LABETALOL HCL 200 MG PO TABS: 200.0000 mg | ORAL_TABLET | Freq: Two times a day (BID) | ORAL | 0 refills | Status: DC

## 2017-12-26 NOTE — MAU Note (Signed)
Pt here for NST and BP check. Pt reports no other complaints at this time. No bleeding, no LOF, +FM

## 2017-12-26 NOTE — MAU Provider Note (Signed)
Chief Complaint:  BP check and Non-stress Test   None    HPI: Diamond Dunn is a 36 y.o. [id number] at 1415w3d who presents to maternity admissions for antenatal testing and BP check.  Pt has gestational hypertension and is on Labetalol.  Her labetalol was increased tp 200mg  BID last Wednesday.  Pt denies headache or blurred vision.  Pt declined induction at 37 weeks and opted to have induction by 39 weeks.   Denies contractions, leakage of fluid or vaginal bleeding. Good fetal movement.   Pregnancy Course:   Past Medical History:  Diagnosis Date  . Pregnancy induced hypertension    on labetalol since 37wks   OB History  Gravida Para Term Preterm AB Living  4 3 3  0 0 3  SAB TAB Ectopic Multiple Live Births  0 0 0 0 3    # Outcome Date GA Lbr Len/2nd Weight Sex Delivery Anes PTL Lv  4 Current           3 Term 01/05/15 5533w4d 13:15 / 00:09 3.955 kg (8 lb 11.5 oz) F Vag-Spont EPI  LIV  2 Term 07/09/13 3735w3d 07:56 / 00:15 4.031 kg (8 lb 14.2 oz) M Vag-Spont Local  LIV     Birth Comments: wnl  1 Term 01/08/12 6235w3d 11:38 / 00:14 3.033 kg (6 lb 11 oz) M Vag-Spont Local  LIV     Birth Comments: none      Past Surgical History:  Procedure Laterality Date  . NO PAST SURGERIES    . VARICOSE VEIN SURGERY    . WISDOM TOOTH EXTRACTION     Family History  Problem Relation Age of Onset  . Anesthesia problems Neg Hx   . Hypotension Neg Hx   . Malignant hyperthermia Neg Hx   . Pseudochol deficiency Neg Hx    Social History   Tobacco Use  . Smoking status: Never Smoker  . Smokeless tobacco: Never Used  Substance Use Topics  . Alcohol use: No  . Drug use: No   Allergies  Allergen Reactions  . Miconazole Rash    Pt got a rash after using but not sure if the rash was caused by monistat   No medications prior to admission.    I have reviewed patient's Past Medical Hx, Surgical Hx, Family Hx, Social Hx, medications and allergies.   ROS:  Review of Systems  All other systems  reviewed and are negative.   Physical Exam   Patient Vitals for the past 24 hrs:  BP Temp Temp src Pulse Resp  12/26/17 1146 139/86 - - 84 -  12/26/17 1124 124/83 (!) 97.5 F (36.4 C) Oral 88 18   Constitutional: Well-developed, well-nourished female in no acute distress.  Cardiovascular: normal rate Respiratory: normal effort GI: Abd soft, non-tender, gravid appropriate for gestational age. Pos BS x 4     FHT:  Baseline 120 , moderate variability, accelerations present, no decelerations Contractions: occ   Labs: No results found for this or any previous visit (from the past 24 hour(s)).  Imaging:  No results found.  MAU Course: Orders Placed This Encounter  Procedures  . Discharge patient Discharge disposition: 01-Home or Self Care; Discharge patient date: 12/26/2017   Meds ordered this encounter  Medications  . labetalol (NORMODYNE) tablet 200 mg  . labetalol (NORMODYNE) 200 MG tablet    Sig: Take 1 tablet (200 mg total) by mouth 2 (two) times daily.    Dispense:  60 tablet    Refill:  0    MDM: NST and BP Assessment: 1. Gestational hypertension w/o significant proteinuria in 3rd trimester     Plan: Discharge home in stable condition.  Reviewed Labor precautions and fetal kick counts and signs and symptoms of worsening hypertension.  Follow-up Information    [facility] up in 4 day(s).   Specialty:  Obstetrics and Gynecology Contact information: [address] [place] [phone]          Allergies as of 12/26/2017      Reactions   Miconazole Rash   Pt got a rash after using but not sure if the rash was caused by monistat      Medication List    TAKE these medications   calcium carbonate 500 MG chewable tablet Commonly known as:  TUMS - dosed in mg elemental calcium Chew 1-2 tablets by mouth daily as needed for indigestion or heartburn.   labetalol 100 MG  tablet Commonly known as:  NORMODYNE Take 100 mg by mouth 2 (two) times daily. What changed:  Another medication with the same name was added. Make sure you understand how and when to take each.   labetalol 200 MG tablet Commonly known as:  NORMODYNE Take 1 tablet (200 mg total) by mouth 2 (two) times daily. What changed:  You were already taking a medication with the same name, and this prescription was added. Make sure you understand how and when to take each.   prenatal multivitamin Tabs tablet Take 1 tablet by mouth daily.       Kenney Housemanrothero, Laden Fieldhouse Jean, CNM 12/26/2017 12:19 PM

## 2017-12-26 NOTE — Discharge Instructions (Signed)

## 2017-12-26 NOTE — MAU Note (Signed)
Urine in lab 

## 2017-12-28 NOTE — L&D Delivery Note (Signed)
Delivery Note At 9:13 PM, on Jan 02, 2018, a viable female "Diamond Dunn" was delivered via Vaginal, Spontaneous (Presentation:Vertex En Caul).  Shoulders and body delivered easily within caul that was ruptured; clear fluid noted. Once sac removed a tight double nuchal and body cord was noted and reduced by provider. Tactile stimulation given by provider and infant placed on mother's abdomen where nurse continued tactile stimulation.  Infant with good tone and heart rate, but started to show signs of decompensation around 3 minutes of life.  Cord cut and infant taken to warmer where Code APGAR was called.  Neonatologist O. Linthavong, MD assumed care for secondary apnea.  Infant APGAR: 6,4,8.  Cord blood collected and gases deferred. Placenta delivered spontaneously and noted to be intact with 3VC upon inspection.  Placenta sent to pathology for GHTN diagnosis.  Vaginal inspection revealed a 2nd degree perineal laceration that was repaired with 3-0 vicryl on CT-1. Inspection also revealed rectal hemorrhoids. No additional anesthetic necessary and patient tolerated the procedure well. Fundus firm, at the umbilicus, and bleeding small.  Mother hemodynamically stable and infant skin to skin prior to provider exit.  Family desires vascetomy for birth control and opts to breastfeed.  Family wishes for infant to be circumcised during inpatient stay. Infant weight at one hour of life: 7lbs 9oz, 19.25in   Anesthesia:  Epidural Episiotomy: None Lacerations: 2nd degree;Perineal Suture Repair: 3.0 vicryl Est. Blood Loss (mL): 400  Mom to postpartum.  Baby to Couplet care / Skin to Skin.  Cherre RobinsJessica L Almina Schul, MSN, CNM 01/02/2018, 10:04 PM

## 2017-12-31 ENCOUNTER — Other Ambulatory Visit: Payer: Self-pay | Admitting: Obstetrics & Gynecology

## 2017-12-31 ENCOUNTER — Telehealth (HOSPITAL_COMMUNITY): Payer: Self-pay | Admitting: *Deleted

## 2017-12-31 ENCOUNTER — Encounter (HOSPITAL_COMMUNITY): Payer: Self-pay | Admitting: *Deleted

## 2017-12-31 NOTE — Telephone Encounter (Signed)
Preadmission screen  

## 2018-01-02 ENCOUNTER — Inpatient Hospital Stay (HOSPITAL_COMMUNITY): Payer: 59 | Admitting: Anesthesiology

## 2018-01-02 ENCOUNTER — Inpatient Hospital Stay (HOSPITAL_COMMUNITY)
Admission: RE | Admit: 2018-01-02 | Discharge: 2018-01-04 | DRG: 806 | Disposition: A | Discharge status: Home / Self Care | Payer: 59 | Source: Ambulatory Visit | Attending: Obstetrics & Gynecology | Admitting: Obstetrics & Gynecology

## 2018-01-02 ENCOUNTER — Encounter (HOSPITAL_COMMUNITY): Payer: Self-pay

## 2018-01-02 DIAGNOSIS — K611 Rectal abscess: Secondary | ICD-10-CM | POA: Diagnosis present

## 2018-01-02 DIAGNOSIS — O874 Varicose veins of lower extremity in the puerperium: Secondary | ICD-10-CM | POA: Diagnosis present

## 2018-01-02 DIAGNOSIS — O2243 Hemorrhoids in pregnancy, third trimester: Secondary | ICD-10-CM | POA: Diagnosis present

## 2018-01-02 DIAGNOSIS — O99824 Streptococcus B carrier state complicating childbirth: Secondary | ICD-10-CM | POA: Diagnosis present

## 2018-01-02 DIAGNOSIS — Z3A38 38 weeks gestation of pregnancy: Secondary | ICD-10-CM | POA: Diagnosis not present

## 2018-01-02 DIAGNOSIS — O134 Gestational [pregnancy-induced] hypertension without significant proteinuria, complicating childbirth: Secondary | ICD-10-CM | POA: Diagnosis present

## 2018-01-02 DIAGNOSIS — O163 Unspecified maternal hypertension, third trimester: Secondary | ICD-10-CM | POA: Diagnosis present

## 2018-01-02 LAB — RPR: RPR: NONREACTIVE

## 2018-01-02 LAB — CBC
HCT: 36.7 % (ref 36.0–46.0)
HEMATOCRIT: 37.3 % (ref 36.0–46.0)
HEMOGLOBIN: 12.5 g/dL (ref 12.0–15.0)
HEMOGLOBIN: 12.5 g/dL (ref 12.0–15.0)
MCH: 29.8 pg (ref 26.0–34.0)
MCH: 29.9 pg (ref 26.0–34.0)
MCHC: 34.1 g/dL (ref 30.0–36.0)
MCHC: 33.5 g/dL (ref 30.0–36.0)
MCV: 87.6 fL (ref 78.0–100.0)
MCV: 89.2 fL (ref 78.0–100.0)
PLATELETS: 195 10*3/uL (ref 150–400)
Platelets: 194 10*3/uL (ref 150–400)
RBC: 4.19 MIL/uL (ref 3.87–5.11)
RBC: 4.18 MIL/uL (ref 3.87–5.11)
RDW: 13.8 % (ref 11.5–15.5)
RDW: 14.1 % (ref 11.5–15.5)
WBC: 8.7 10*3/uL (ref 4.0–10.5)
WBC: 9.5 10*3/uL (ref 4.0–10.5)

## 2018-01-02 LAB — COMPREHENSIVE METABOLIC PANEL
ALK PHOS: 111 U/L (ref 38–126)
ALT: 11 U/L — ABNORMAL LOW (ref 14–54)
AST: 32 U/L (ref 15–41)
Albumin: 2.9 g/dL — ABNORMAL LOW (ref 3.5–5.0)
Anion gap: 11 (ref 5–15)
BUN: 8 mg/dL (ref 6–20)
CALCIUM: 9.6 mg/dL (ref 8.9–10.3)
CHLORIDE: 104 mmol/L (ref 101–111)
CO2: 20 mmol/L — ABNORMAL LOW (ref 22–32)
CREATININE: 0.5 mg/dL (ref 0.44–1.00)
Glucose, Bld: 91 mg/dL (ref 65–99)
Potassium: 4.7 mmol/L (ref 3.5–5.1)
Sodium: 135 mmol/L (ref 135–145)
TOTAL PROTEIN: 6.4 g/dL — AB (ref 6.5–8.1)
Total Bilirubin: 0.7 mg/dL (ref 0.3–1.2)

## 2018-01-02 LAB — PROTEIN / CREATININE RATIO, URINE: Creatinine, Urine: 27 mg/dL

## 2018-01-02 LAB — LACTATE DEHYDROGENASE: LDH: 261 U/L — AB (ref 98–192)

## 2018-01-02 LAB — URIC ACID: Uric Acid, Serum: 6.3 mg/dL (ref 2.3–6.6)

## 2018-01-02 LAB — TYPE AND SCREEN
ABO/RH(D): O POS
ANTIBODY SCREEN: NEGATIVE

## 2018-01-02 MED ORDER — LABETALOL HCL 200 MG PO TABS
200.0000 mg | ORAL_TABLET | Freq: Two times a day (BID) | ORAL | Status: DC
  Administered 2018-01-02: 200 mg via ORAL
  Filled 2018-01-02: qty 1

## 2018-01-02 MED ORDER — PHENYLEPHRINE 40 MCG/ML (10ML) SYRINGE FOR IV PUSH (FOR BLOOD PRESSURE SUPPORT)
80.0000 ug | PREFILLED_SYRINGE | INTRAVENOUS | Status: DC | PRN
  Filled 2018-01-02: qty 5

## 2018-01-02 MED ORDER — PENICILLIN G POTASSIUM 5000000 UNITS IJ SOLR
5.0000 10*6.[IU] | Freq: Once | INTRAVENOUS | Status: AC
  Administered 2018-01-02: 5 10*6.[IU] via INTRAVENOUS
  Filled 2018-01-02: qty 5

## 2018-01-02 MED ORDER — OXYTOCIN 40 UNITS IN LACTATED RINGERS INFUSION - SIMPLE MED: 1.0000 m[IU]/min | INTRAVENOUS | Status: DC

## 2018-01-02 MED ORDER — LACTATED RINGERS IV SOLN: 500.0000 mL | Freq: Once | INTRAVENOUS | Status: DC

## 2018-01-02 MED ORDER — LIDOCAINE HCL (PF) 1 % IJ SOLN
30.0000 mL | INTRAMUSCULAR | Status: DC | PRN
  Filled 2018-01-02: qty 30

## 2018-01-02 MED ORDER — OXYTOCIN 40 UNITS IN LACTATED RINGERS INFUSION - SIMPLE MED
2.5000 [IU]/h | INTRAVENOUS | Status: DC
  Administered 2018-01-02: 2.5 [IU]/h via INTRAVENOUS

## 2018-01-02 MED ORDER — ONDANSETRON HCL 4 MG/2ML IJ SOLN
4.0000 mg | Freq: Four times a day (QID) | INTRAMUSCULAR | Status: DC | PRN
  Filled 2018-01-02: qty 2

## 2018-01-02 MED ORDER — LIDOCAINE HCL (PF) 1 % IJ SOLN
INTRAMUSCULAR | Status: DC | PRN
  Administered 2018-01-02: 5 mL via EPIDURAL
  Administered 2018-01-02: 5 mL

## 2018-01-02 MED ORDER — TERBUTALINE SULFATE 1 MG/ML IJ SOLN
0.2500 mg | Freq: Once | INTRAMUSCULAR | Status: DC | PRN
  Filled 2018-01-02: qty 1

## 2018-01-02 MED ORDER — OXYTOCIN BOLUS FROM INFUSION
500.0000 mL | Freq: Once | INTRAVENOUS | Status: AC
  Administered 2018-01-02: 500 mL via INTRAVENOUS

## 2018-01-02 MED ORDER — FENTANYL CITRATE (PF) 100 MCG/2ML IJ SOLN: 50.0000 ug | INTRAMUSCULAR | Status: DC | PRN

## 2018-01-02 MED ORDER — FLEET ENEMA 7-19 GM/118ML RE ENEM: 1.0000 | ENEMA | RECTAL | Status: DC | PRN

## 2018-01-02 MED ORDER — EPHEDRINE 5 MG/ML INJ
10.0000 mg | INTRAVENOUS | Status: DC | PRN
  Filled 2018-01-02: qty 2

## 2018-01-02 MED ORDER — MISOPROSTOL 25 MCG QUARTER TABLET
25.0000 ug | ORAL_TABLET | ORAL | Status: DC
  Administered 2018-01-02 (×2): 25 ug via VAGINAL
  Filled 2018-01-02 (×5): qty 1

## 2018-01-02 MED ORDER — SOD CITRATE-CITRIC ACID 500-334 MG/5ML PO SOLN: 30.0000 mL | ORAL | Status: DC | PRN

## 2018-01-02 MED ORDER — FENTANYL 2.5 MCG/ML BUPIVACAINE 1/10 % EPIDURAL INFUSION (WH - ANES)
14.0000 mL/h | INTRAMUSCULAR | Status: DC | PRN
  Administered 2018-01-02: 14 mL/h via EPIDURAL

## 2018-01-02 MED ORDER — PENICILLIN G POT IN DEXTROSE 60000 UNIT/ML IV SOLN
3.0000 10*6.[IU] | INTRAVENOUS | Status: DC
  Administered 2018-01-02 (×4): 3 10*6.[IU] via INTRAVENOUS
  Filled 2018-01-02 (×8): qty 50

## 2018-01-02 MED ORDER — FENTANYL 2.5 MCG/ML BUPIVACAINE 1/10 % EPIDURAL INFUSION (WH - ANES)
INTRAMUSCULAR | Status: AC
  Filled 2018-01-02: qty 100

## 2018-01-02 MED ORDER — OXYTOCIN 40 UNITS IN LACTATED RINGERS INFUSION - SIMPLE MED
1.0000 m[IU]/min | INTRAVENOUS | Status: DC
  Administered 2018-01-02: 1 m[IU]/min via INTRAVENOUS
  Filled 2018-01-02: qty 1000

## 2018-01-02 MED ORDER — DIPHENHYDRAMINE HCL 50 MG/ML IJ SOLN: 12.5000 mg | INTRAMUSCULAR | Status: DC | PRN

## 2018-01-02 MED ORDER — LACTATED RINGERS IV SOLN
INTRAVENOUS | Status: DC
  Administered 2018-01-02 (×3): via INTRAVENOUS

## 2018-01-02 MED ORDER — PHENYLEPHRINE 40 MCG/ML (10ML) SYRINGE FOR IV PUSH (FOR BLOOD PRESSURE SUPPORT)
PREFILLED_SYRINGE | INTRAVENOUS | Status: AC
  Filled 2018-01-02: qty 20

## 2018-01-02 MED ORDER — ACETAMINOPHEN 325 MG PO TABS
650.0000 mg | ORAL_TABLET | ORAL | Status: DC | PRN
Start: 2018-01-02 — End: 2018-01-03

## 2018-01-02 MED ORDER — LACTATED RINGERS IV SOLN: 500.0000 mL | INTRAVENOUS | Status: DC | PRN

## 2018-01-02 NOTE — Progress Notes (Addendum)
  Subjective: No change in UC quality or intensity, patient ready for pitocin to be initiated. Objective: BP (!) 132/47 (BP Location: Left Arm)   Pulse 100   Temp 97.9 F (36.6 C) (Oral)   Resp 18   Ht 5\' 6"  (1.676 m)   Wt 108.2 kg (238 lb 9.6 oz)   LMP 04/08/2017   BMI 38.51 kg/m  No intake/output data recorded. No intake/output data recorded.   Vitals:   01/02/18 0259 01/02/18 0410 01/02/18 0446 01/02/18 0708  BP: (!) 157/74 (!) 143/86 130/64 (!) 132/47  Pulse: 89 98 90 100  Resp: 16  18 18   Temp: 97.6 F (36.4 C)   97.9 F (36.6 C)  TempSrc: Oral   Oral  Weight:      Height:        FHT: Category 1 UC:   Irregular, mild SVE:   Dilation: 1.5 Effacement (%): 50 Station: -2 Exam by:: Vicky Angelly Spearing,CNM Cervix very soft, small amount bloody mucus. S/p 2nd dose cytotech at 0750.  Labetalol 200 mg po at 0832  Assessment:  Induction for gestational HTN GBS positive  Plan: Start pitocin per low dose protocol. Risks and benefits of induction with pitocin were reviewed, including failure of method, prolonged labor, need for further intervention, risk of cesarean.   Patient and family seem to understand these risks and wish to proceed.  Request pitocin be started at lowest dose and increased at 1 mu/min. Epidural prn. Dr. Richardson Doppole updated.  Diamond BridgemanVicki Leng Dunn CNM 01/02/2018, 11:56 AM

## 2018-01-02 NOTE — Anesthesia Preprocedure Evaluation (Signed)
Anesthesia Evaluation  Patient identified by MRN, date of birth, ID band Patient awake    Reviewed: Allergy & Precautions, H&P , NPO status , Patient's Chart, lab work & pertinent test results  History of Anesthesia Complications Negative for: history of anesthetic complications  Airway Mallampati: II  TM Distance: >3 FB Neck ROM: full    Dental no notable dental hx. (+) Teeth Intact   Pulmonary neg pulmonary ROS,    Pulmonary exam normal breath sounds clear to auscultation       Cardiovascular hypertension, Pt. on medications and Pt. on home beta blockers Normal cardiovascular exam Rhythm:regular Rate:Normal     Neuro/Psych negative neurological ROS  negative psych ROS   GI/Hepatic negative GI ROS, Neg liver ROS,   Endo/Other  negative endocrine ROS  Renal/GU negative Renal ROS  negative genitourinary   Musculoskeletal   Abdominal   Peds  Hematology negative hematology ROS (+)   Anesthesia Other Findings   Reproductive/Obstetrics (+) Pregnancy                             Anesthesia Physical Anesthesia Plan  ASA: II  Anesthesia Plan: Epidural   Post-op Pain Management:    Induction:   PONV Risk Score and Plan:   Airway Management Planned:   Additional Equipment:   Intra-op Plan:   Post-operative Plan:   Informed Consent: I have reviewed the patients History and Physical, chart, labs and discussed the procedure including the risks, benefits and alternatives for the proposed anesthesia with the patient or authorized representative who has indicated his/her understanding and acceptance.     Plan Discussed with:   Anesthesia Plan Comments:         Anesthesia Quick Evaluation

## 2018-01-02 NOTE — H&P (Signed)
Diamond Dunn is a 37 y.o. female, G4P3003 at 38.3 weeks, presenting for IOL s/t GHTN. Patient is under the care of [facility] and pregnancy history significant for GHTN, AMA, GBS Positive, Varices, and 3rd Degree Laceration with subsequent perirectal abscess. Patient currently taking 200mg  of Labetalol BID.   Patient Active Problem List   Diagnosis Date Noted  . AMA (advanced maternal age) multigravida 35+, first trimester 06/27/2017  . Obesity affecting pregnancy 06/27/2017  . H/O maternal third degree perineal laceration, currently pregnant 06/27/2017  . 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 Perirectal abscess (after 3rd degree) 06/27/2017    History of present pregnancy: Patient entered care at 9 weeks.   Significant prenatal events: Resolution of fetal pyelectasis. Started on labetalol for GHTN at 36 weeks and informed of recommendation for IOL at 37 weeks.   Last evaluation:  12/29/2017 by Dr. Essie HartWalda Pinn  OB History    Gravida Para Term Preterm AB Living   4 3 3  0 0 3   SAB TAB Ectopic Multiple Live Births   0 0 0 0 3     Past Medical History:  Diagnosis Date  . 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 Pregnancy induced hypertension    on labetalol since 37wks   Past Surgical History:  Procedure Laterality Date  . NO PAST SURGERIES    . VARICOSE VEIN SURGERY    . WISDOM TOOTH EXTRACTION     Family History: family history is not on file. Social History:  reports that  has never smoked. she has never used smokeless tobacco. She reports that she does not drink alcohol or use drugs.   Prenatal Transfer Tool  Maternal Diabetes: No Genetic Screening: Normal Maternal Ultrasounds/Referrals: Normal Fetal Ultrasounds or other Referrals:  None Maternal Substance Abuse:  No Significant Maternal Medications:  Meds include: Other:  Significant Maternal Lab Results: Lab values include: Group B Strep positive    ROS:  +Ctx, +FM, -LoF, -VB  Allergies  Allergen  Reactions  . Miconazole Rash    Pt got a rash after using but not sure if the rash was caused by monistat    Results for orders placed or performed during the hospital encounter of 01/02/18 (from the past 24 hour(s))  Comprehensive metabolic panel     Status: Abnormal   Collection Time: 01/02/18  1:27 AM  Result Value Ref Range   Sodium 135 135 - 145 mmol/L   Potassium 4.7 3.5 - 5.1 mmol/L   Chloride 104 101 - 111 mmol/L   CO2 20 (L) 22 - 32 mmol/L   Glucose, Bld 91 65 - 99 mg/dL   BUN 8 6 - 20 mg/dL   Creatinine, Ser 1.610.50 0.44 - 1.00 mg/dL   Calcium 9.6 8.9 - 09.610.3 mg/dL   Total Protein 6.4 (L) 6.5 - 8.1 g/dL   Albumin 2.9 (L) 3.5 - 5.0 g/dL   AST 32 15 - 41 U/L   ALT 11 (L) 14 - 54 U/L   Alkaline Phosphatase 111 38 - 126 U/L   Total Bilirubin 0.7 0.3 - 1.2 mg/dL   GFR calc non Af Amer >60 >60 mL/min   GFR calc Af Amer >60 >60 mL/min   Anion gap 11 5 - 15  Lactate dehydrogenase     Status: Abnormal   Collection Time: 01/02/18  1:27 AM  Result Value Ref Range   LDH 261 (H) 98 - 192 U/L  Uric acid     Status: None  Collection Time: 01/02/18  1:27 AM  Result Value Ref Range   Uric Acid, Serum 6.3 2.3 - 6.6 mg/dL  CBC     Status: None   Collection Time: 01/02/18  1:27 AM  Result Value Ref Range   WBC 8.7 4.0 - 10.5 K/uL   RBC 4.19 3.87 - 5.11 MIL/uL   Hemoglobin 12.5 12.0 - 15.0 g/dL   HCT 19.1 47.8 - 29.5 %   MCV 87.6 78.0 - 100.0 fL   MCH 29.8 26.0 - 34.0 pg   MCHC 34.1 30.0 - 36.0 g/dL   RDW 62.1 30.8 - 65.7 %   Platelets 195 150 - 400 K/uL  Type and screen     Status: None   Collection Time: 01/02/18  1:30 AM  Result Value Ref Range   ABO/RH(D) O POS    Antibody Screen NEG    Sample Expiration 01/05/2018       Last menstrual period 04/08/2017, unknown if currently breastfeeding.  Physical Exam  Constitutional: She is oriented to person, place, and time. She appears well-developed and well-nourished. No distress.  HENT:  Head: Normocephalic and atraumatic.   Eyes: Conjunctivae are normal.  Cardiovascular: Normal rate, regular rhythm and normal heart sounds.  Respiratory: Effort normal and breath sounds normal.  GI: Soft.  Musculoskeletal: Normal range of motion. She exhibits edema.  Neurological: She is alert and oriented to person, place, and time.  Skin: Skin is warm and dry.   Dilation: 1.5 Effacement (%): 50 Cervical Position: Middle Station: -2 Presentation: Vertex Exam by:: Fronnie Urton,CNM  Leop[place]: EFW: 8lbs Presentation:Vertex  FHR: 125 bpm, Mod Var, -Decels, +Accels UCs: None graphed  Prenatal labs: ABO, Rh: O/Positive/-- (06/07 0000) Antibody: Negative (06/07 0000) Rubella:  Immune RPR: Nonreactive (06/07 0000)  HBsAg: Negative (06/07 0000)  HIV: Non-reactive (06/07 0000)  GBS: Positive (12/20 0000) Sickle cell/Hgb electrophoresis:  N/A Pap:  Unknown GC:  NR Chlamydia:  NR Other:  Baseline PIH Negative    Assessment IUP at 38.3wks Cat I FT GHTN IOL Bishop Score: 6 GBS Positive PIH Labs Normal  Plan: -Admit to [facility] -Routine Labor and Delivery Orders per [facility] Protocol -In room to complete assessment and discuss POC: -Patient expresses concern with initiation of pitocin as induction method.  States she was informed cytotec would be the method of induction and she is "not ready" for pitocin. -Provider addressed questions and concerns and after cervical exam expressed to patient that cytotec would be an unnecessary method as cervix is ripe. -Patient continued to express desire for cytotec placement and refused foley bulb stating she did not want to be in pain -Provider informed patient of availability of IV medications, nitrous oxide, and epidural for pain management -Patient informed that this provider did not feel comfortable placing cytotec as it would be an unnecessary intervention.  Patient requests 2nd opinion and Dr. Enid Baas contacted who advised that foley bulb and/or pitocin initiation would  be appropriate methods, but cytotec placement also appropriate as patient w/o treatable BP and Cat I FT.  -Cytotec placed by nurse -Start PCN for prophylaxis -Dr.EV to be updated as appropriate  Joellyn Quails, MSN 01/02/2018, 12:44 AM

## 2018-01-02 NOTE — Progress Notes (Signed)
  Subjective: Reports UCs more frequent and a little stronger--requests pitocin to be increased at a higher rate.  Objective: BP 132/78   Pulse 84   Temp 97.7 F (36.5 C) (Oral)   Resp 18   Ht 5\' 6"  (1.676 m)   Wt 108.2 kg (238 lb 9.6 oz)   LMP 04/08/2017   BMI 38.51 kg/m  No intake/output data recorded. No intake/output data recorded.   Vitals:   01/02/18 1401 01/02/18 1431 01/02/18 1501 01/02/18 1531  BP: (!) 156/96 137/83 129/84 132/78  Pulse: 84 83 83 84  Resp: 16 18 18 18   Temp:   97.7 F (36.5 C)   TempSrc:   Oral   Weight:      Height:       BP 1431 to 1531 129-137/78-84  FHT: Category 1 UC:   q 3-5 min, mild SVE:   Dilation: 1.5 Effacement (%): 50 Station: -2 Exam by:: Vicky Aarica Wax,CNM at 1145 Pitocin at 6 mu/min  Assessment:  Induction for gestational hypertension GBS positive Latent labor  Plan: Reviewed status of induction in latent labor with patient, encouraged patience with process, normal slow progression of latent to early/active labor. Increase pitocin by 2 mu/min. Support to patient. Advised her will check prn. Repeat CBC prior to epidural.  Nigel BridgemanVicki Heela Heishman CNM 01/02/2018, 4:17 PM

## 2018-01-02 NOTE — Progress Notes (Signed)
Diamond HarborBrianne Dunn MRN: [id number]  Subjective: -Contacts nurse after review of strip.  Nurse states patient reports rectal pressure and requests exam.   Objective: BP 127/63   Pulse 76   Temp 97.7 F (36.5 C) (Axillary)   Resp 16   Ht 5\' 6"  (1.676 m)   Wt 108.2 kg (238 lb 9.6 oz)   LMP 04/08/2017   SpO2 100%   BMI 38.51 kg/m  No intake/output data recorded. No intake/output data recorded.  Fetal Monitoring: FHT: 135 bpm, Mod Var, -Decels, +Accels UC: Q2-723min, palpates moderate    Vaginal Exam: SVE:   Dilation: Lip/rim Effacement (%): 80 Station: -1 Exam by:: Gerrit HeckJessica Beautifull Cisar, CNM Membranes:Intact Internal Monitors: None  Augmentation/Induction: Pitocin:7212mUn/min Cytotec: None  Assessment:  IUP at 38.3wks Cat I FT  IOL GHTN  Plan: -Cervix is soft and able to be reduced -Informed patient of exam and patient expresses desire to push -Anticipating SVD -Room prepared for delivery  Valma CavaJessica L Albirda Shiel,MSN, CNM 01/02/2018, 8:56 PM

## 2018-01-02 NOTE — Progress Notes (Addendum)
  Subjective: Aware of more frequent and some stronger UCs.  Denies HA, visual sx, or epigastric pain.  Objective: BP (!) 148/87   Pulse 88   Temp 97.9 F (36.6 C) (Oral)   Resp 16   Ht 5\' 6"  (1.676 m)   Wt 108.2 kg (238 lb 9.6 oz)   LMP 04/08/2017   BMI 38.51 kg/m  No intake/output data recorded. No intake/output data recorded.   Vitals:   01/02/18 1531 01/02/18 1801 01/02/18 1831 01/02/18 1832  BP: 132/78 (!) 144/90 (!) 157/92 (!) 148/87  Pulse: 84 96 88 88  Resp: 18  16   Temp:   97.9 F (36.6 C)   TempSrc:   Oral   Weight:      Height:       BP 144-157/87-92 in last hour  FHT: Category 1 UC:   regular, every 3 minutes SVE:   Dilation: 2.5 Effacement (%): 70 Station: -1 Exam by:: Nigel BridgemanVicki Myrical Andujo CNM  Slight BBOW Pitocin at 12 mu/min  Next dose Labetalol 200 mg po at 8pm.  Assessment:  Induction for gestational HTN GBS positive Now early labor  Plan: Continue pitocin induction. Repeat CBC now in anticipation of epidural in the next 6 hours. Epidural prn. Support to patient for patience needed during induction process. Prefers deferral of AROM until further along in labor. Dr. Richardson Doppole updated.  Nigel BridgemanVicki Jef Futch CNM 01/02/2018, 6:33 PM

## 2018-01-02 NOTE — Anesthesia Pain Management Evaluation Note (Signed)
  [facility] Pain Management Visit Note  Patient: Diamond Dunn, 37 y.o., female  "Hello I am a member of the anesthesia team at [facility]. We have an anesthesia team available at all times to provide care throughout the hospital, including epidural management and anesthesia for C-section. I don't know your plan for the delivery whether it a natural birth, water birth, IV sedation, nitrous supplementation, doula or epidural, but we want to meet your pain goals."   1.Was your pain managed to your expectations on prior hospitalizations?   Yes   2.What is your expectation for pain management during this hospitalization?     Epidural  3.How can we help you reach that goal?   Record the patient's initial score and the patient's pain goal.   Pain: 0  Pain Goal: 5 [facility] wants you to be able to say your pain was always managed very well.  Diamond Dunn,Diamond Dunn 01/02/2018

## 2018-01-02 NOTE — Anesthesia Procedure Notes (Signed)
Epidural Patient location during procedure: OB  Staffing Anesthesiologist: Deshanae Lindo, MD Performed: anesthesiologist   Preanesthetic Checklist Completed: patient identified, site marked, surgical consent, pre-op evaluation, timeout performed, IV checked, risks and benefits discussed and monitors and equipment checked  Epidural Patient position: sitting Prep: DuraPrep Patient monitoring: heart rate, continuous pulse ox and blood pressure Approach: right paramedian Location: L3-L4 Injection technique: LOR saline  Needle:  Needle type: Tuohy  Needle gauge: 17 G Needle length: 9 cm and 9 Needle insertion depth: 8 cm Catheter type: closed end flexible Catheter size: 20 Guage Catheter at skin depth: 12 cm Test dose: negative  Assessment Events: blood not aspirated, injection not painful, no injection resistance, negative IV test and no paresthesia  Additional Notes Patient identified. Risks/Benefits/Options discussed with patient including but not limited to bleeding, infection, nerve damage, paralysis, failed block, incomplete pain control, headache, blood pressure changes, nausea, vomiting, reactions to medication both or allergic, itching and postpartum back pain. Confirmed with bedside nurse the patient's most recent platelet count. Confirmed with patient that they are not currently taking any anticoagulation, have any bleeding history or any family history of bleeding disorders. Patient expressed understanding and wished to proceed. All questions were answered. Sterile technique was used throughout the entire procedure. Please see nursing notes for vital signs. Test dose was given through epidural needle and negative prior to continuing to dose epidural or start infusion. Warning signs of high block given to the patient including shortness of breath, tingling/numbness in hands, complete motor block, or any concerning symptoms with instructions to call for help. Patient was given  instructions on fall risk and not to get out of bed. All questions and concerns addressed with instructions to call with any issues.     

## 2018-01-02 NOTE — Progress Notes (Addendum)
  Subjective: Aware of scattered UCs, but not uncomfortable.  Denies HA, visual sx, or epigastric pain.    Objective: BP (!) 132/47 (BP Location: Left Arm)   Pulse 100   Temp 97.9 F (36.6 C) (Oral)   Resp 18   Ht 5\' 6"  (1.676 m)   Wt 108.2 kg (238 lb 9.6 oz)   LMP 04/08/2017   BMI 38.51 kg/m  No intake/output data recorded. No intake/output data recorded.   Vitals:   01/02/18 0259 01/02/18 0410 01/02/18 0446 01/02/18 0708  BP: (!) 157/74 (!) 143/86 130/64 (!) 132/47  Pulse: 89 98 90 100  Resp: 16  18 18   Temp: 97.6 F (36.4 C)   97.9 F (36.6 C)  TempSrc: Oral   Oral  Weight:      Height:        FHT: Category 1 UC:   irregular, every 3-8 minutes SVE:   Dilation: 1 Effacement (%): 50 Station: -2 Exam by:: Horald ChestnutVicky Makalah Asberry, CNM  Small bloody show noted, cervix soft, loose 1 cm Negative Homan's, superficial varicosities noted Cytotech #1 placed at 0256.  PIH labs WNL, PCR too low to calculate.  Assessment:  IUP at 38 3/7 weeks--induction for gestational hypertension, on Labetalol 200 mg po BID. GBS positive AMA Hx 3rd degree, with peri-rectal abscess Superficial varicose veins BMI 38   Plan: Discussion of status and options for care, questions reviewed. Declines foley. Prefers additional dose of cytotech, then agreeable to pitocin in 4 hours if no active labor. OK for light meal this am. Continue Labetalol 200 mg po BID--will give am dose now. Dr. Richardson Doppole updated on patient status.  Nigel BridgemanVicki Dereon Williamsen CNM 01/02/2018, 7:54 AM

## 2018-01-02 NOTE — Progress Notes (Signed)
Diamond HarborBrianne Boehne MRN: [id number]  Subjective: -Patient reports back pain and pressure prior to epidural placement. Patient denies HA, visual disturbances, and epigastric pain, but does report some dizziness during epidural placement.   Objective: BP 113/63   Pulse 71   Temp 97.7 F (36.5 C) (Axillary)   Resp 16   Ht 5\' 6"  (1.676 m)   Wt 108.2 kg (238 lb 9.6 oz)   LMP 04/08/2017   SpO2 99%   BMI 38.51 kg/m  No intake/output data recorded. No intake/output data recorded.  Fetal Monitoring: FHT: 130 bpm, Mod Var, -Decels, +Accels UC: Q2-103min, palpates moderate    Vaginal Exam: SVE:   Dilation: 2.5 Effacement (%): 70 Station: -1 Exam by:: Nigel BridgemanVicki Latham CNM at 1815 Membranes:Intact Internal Monitors: None  Augmentation/Induction: Pitocin:10212mUn/min Cytotec: S/P 2 Doses  Assessment:  IUP at 38.3wks Cat I FT  IOL GHTN  Plan: -BP normal range-Hold 8pm labetalol dose -Discussed holding of medication and potential need for it later in labor or pp process. -Will defer VE for 4 hours or until patient with constant rectal pressure; whichever occurs first -No q/c -Continue other mgmt as ordered  Valma CavaJessica L Jereld Presti,MSN, CNM 01/02/2018, 7:54 PM

## 2018-01-03 MED ORDER — WITCH HAZEL-GLYCERIN EX PADS
1.0000 "application " | MEDICATED_PAD | CUTANEOUS | Status: DC | PRN
  Administered 2018-01-03: 1 via TOPICAL

## 2018-01-03 MED ORDER — ACETAMINOPHEN 325 MG PO TABS: 650.0000 mg | ORAL_TABLET | ORAL | Status: DC | PRN

## 2018-01-03 MED ORDER — ZOLPIDEM TARTRATE 5 MG PO TABS: 5.0000 mg | ORAL_TABLET | Freq: Every evening | ORAL | Status: DC | PRN

## 2018-01-03 MED ORDER — SIMETHICONE 80 MG PO CHEW
80.0000 mg | CHEWABLE_TABLET | ORAL | Status: DC | PRN
Start: 2018-01-03 — End: 2018-01-04

## 2018-01-03 MED ORDER — COCONUT OIL OIL
1.0000 "application " | TOPICAL_OIL | Status: DC | PRN
  Administered 2018-01-04: 1 via TOPICAL
  Filled 2018-01-03: qty 120

## 2018-01-03 MED ORDER — DIPHENHYDRAMINE HCL 25 MG PO CAPS: 25.0000 mg | ORAL_CAPSULE | Freq: Four times a day (QID) | ORAL | Status: DC | PRN

## 2018-01-03 MED ORDER — IBUPROFEN 600 MG PO TABS
600.0000 mg | ORAL_TABLET | Freq: Four times a day (QID) | ORAL | Status: DC
  Administered 2018-01-03 – 2018-01-04 (×7): 600 mg via ORAL
  Filled 2018-01-03 (×7): qty 1

## 2018-01-03 MED ORDER — SENNOSIDES-DOCUSATE SODIUM 8.6-50 MG PO TABS
2.0000 | ORAL_TABLET | ORAL | Status: DC
  Administered 2018-01-03 (×2): 2 via ORAL
  Filled 2018-01-03 (×2): qty 2

## 2018-01-03 MED ORDER — DIBUCAINE 1 % RE OINT
1.0000 "application " | TOPICAL_OINTMENT | RECTAL | Status: DC | PRN
  Administered 2018-01-03: 1 via RECTAL
  Filled 2018-01-03: qty 28

## 2018-01-03 MED ORDER — LABETALOL HCL 200 MG PO TABS: 200.0000 mg | ORAL_TABLET | Freq: Two times a day (BID) | ORAL | Status: DC

## 2018-01-03 MED ORDER — ONDANSETRON HCL 4 MG/2ML IJ SOLN: 4.0000 mg | INTRAMUSCULAR | Status: DC | PRN

## 2018-01-03 MED ORDER — LABETALOL HCL 100 MG PO TABS
100.0000 mg | ORAL_TABLET | Freq: Two times a day (BID) | ORAL | Status: DC
  Administered 2018-01-03 – 2018-01-04 (×2): 100 mg via ORAL
  Filled 2018-01-03 (×2): qty 1

## 2018-01-03 MED ORDER — TETANUS-DIPHTH-ACELL PERTUSSIS 5-2.5-18.5 LF-MCG/0.5 IM SUSP: 0.5000 mL | Freq: Once | INTRAMUSCULAR | Status: DC

## 2018-01-03 MED ORDER — BENZOCAINE-MENTHOL 20-0.5 % EX AERO
1.0000 | INHALATION_SPRAY | CUTANEOUS | Status: DC | PRN
Start: 2018-01-03 — End: 2018-01-04
  Administered 2018-01-03: 1 via TOPICAL
  Filled 2018-01-03: qty 56

## 2018-01-03 MED ORDER — PRENATAL MULTIVITAMIN CH
1.0000 | ORAL_TABLET | Freq: Every day | ORAL | Status: DC
  Administered 2018-01-03 – 2018-01-04 (×2): 1 via ORAL
  Filled 2018-01-03 (×2): qty 1

## 2018-01-03 MED ORDER — ONDANSETRON HCL 4 MG PO TABS
4.0000 mg | ORAL_TABLET | ORAL | Status: DC | PRN
Start: 2018-01-03 — End: 2018-01-04

## 2018-01-03 NOTE — Progress Notes (Addendum)
Diamond Dunn  Post Partum Day 1:S/P SVD with Repair of Second Degree Perineal Laceration  Subjective: Patient up ad lib, denies syncope or dizziness. Reports consuming regular diet without issues and denies N/V. Denies issues with urination and reports bleeding is "light."  Patient is breastfeeding and reports some difficulties with infant latch.  Desires vascetomy for postpartum contraception.  Pain is being appropriately managed with use of motrin.  Objective: Vitals:   01/02/18 2246 01/02/18 2301 01/03/18 0100 01/03/18 0500  BP: 134/73 137/82 (!) 149/79 126/71  Pulse: 89 97 97 89  Resp:   18 18  Temp:   98 F (36.7 C) 98.2 F (36.8 C)  TempSrc:   Oral Oral  SpO2:      Weight:      Height:       Recent Labs    01/02/18 0127 01/02/18 1837  HGB 12.5 12.5  HCT 36.7 37.3    Physical Exam:  General: alert, cooperative and no distress Mood/Affect: Appropriate/Mild Anxiety Lungs: clear to auscultation, no wheezes, rales or rhonchi, symmetric air entry.  Heart: normal rate and regular rhythm. Breast: breasts appear normal, no suspicious masses, no skin or nipple changes or axillary nodes, not examined. Abdomen:  + bowel sounds, Soft, NT Uterine Fundus: firm at the U Lochia: appropriate Laceration: Not assessed Skin: Warm, Dry DVT Evaluation: No evidence of DVT seen on physical exam. Calf/Ankle edema is present.  Assessment S/P Vaginal Delivery-Day 1 Normal Involution BreastFeeding Female Infant GHTN  Plan: Lactation Consult Inpatient Circ PIH Labs Pending Labetalol discontinued  Monitor BP throughout the day Continue current care Instructed to contact on call provider if she desire discharge at 24 hours Dr. Priscille HeidelbergW. Pinn to be updated on patient status   Cherre RobinsJessica L Kelis Plasse, MSN, CNM 01/03/2018, 6:35 AM

## 2018-01-03 NOTE — Progress Notes (Signed)
Patient and her husband refused to have CBC drawn this morning due to exhaustion. Patient was just going to sleep. Dr, Mora ApplPinn notified of patient's refusal and current CBC results. OK to not draw CBC today. Lab notified.

## 2018-01-03 NOTE — Progress Notes (Addendum)
BPs monitored on patient today have been 132-135/ 64-84. Edema noted in feet and ankles, swelling in hand aggravating carpal tunnel symptoms. CMP, uric acid, LDH had been ordered with morning labs that patient declined to have drawn.  Dr. Mora ApplPinn notified of above findings and labs can be dc'd. Will continue to monitor BP  q4h as ordered.

## 2018-01-03 NOTE — Lactation Note (Signed)
This note was copied from a baby's chart. Lactation Consultation Note  Patient Name: Boy Mikki HarborBrianne Lazare Today's Date: 01/03/2018 Reason for consult: Initial assessment  Baby is 17 hours  LC reviewed and updated the doc flow sheet per mom  Baby woke up and LC checked diaper, dry,  LC assisted to latch in the football right breast / with depth/ LC assisted due to  The recessed chin. Per mom comfortable. LC noted some areola edema, and  Instructed mom on the use shells and hand pump.  LC noted the baby to have a recessed chin and recommended the cross cradle  Or the football.  Mother informed of post-discharge support and given phone number to the lactation department, including services for phone call assistance; out-patient appointments; and breastfeeding support group. List of other breastfeeding resources in the community given in the handout. Encouraged mother to call for problems or concerns related to breastfeeding.   Maternal Data Has patient been taught Hand Expression?: Yes Does the patient have breastfeeding experience prior to this delivery?: Yes  Feeding Feeding Type: (last fed at 1330 ) Length of feed: 7 min(per mom 5-7 mins with swallows )  LATCH Score                   Interventions Interventions: Breast feeding basics reviewed;Hand pump  Lactation Tools Discussed/Used WIC Program: No Pump Review: Setup, frequency, and cleaning;Milk Storage Initiated by:: MAI  Date initiated:: 01/03/18   Consult Status Consult Status: Follow-up Date: 01/04/18 Follow-up type: In-patient    Matilde SprangMargaret Ann Sreekar Broyhill 01/03/2018, 2:24 PM

## 2018-01-03 NOTE — Anesthesia Postprocedure Evaluation (Signed)
Anesthesia Post Note  Patient: Diamond Dunn, Diamond Dunn  Procedure(s) Performed: AN AD HOC LABOR EPIDURAL     Patient location during evaluation: Mother Baby Anesthesia Type: Epidural Level of consciousness: awake and alert Pain management: pain level controlled Vital Signs Assessment: post-procedure vital signs reviewed and stable Respiratory status: spontaneous breathing, nonlabored ventilation and respiratory function stable Cardiovascular status: stable Postop Assessment: no headache, no backache, epidural receding, adequate PO intake, no apparent nausea or vomiting and patient able to bend at knees Anesthetic complications: no    Last Vitals:  Vitals:   01/03/18 0100 01/03/18 0500  BP: (!) 149/79 126/71  Pulse: 97 89  Resp: 18 18  Temp: 36.7 C 36.8 C  SpO2:      Last Pain:  Vitals:   01/03/18 0500  TempSrc: Oral  PainSc: 2    Pain Goal:                 Land O'LakesMalinova,Daivik Overley Hristova

## 2018-01-04 ENCOUNTER — Other Ambulatory Visit: Payer: Self-pay

## 2018-01-04 LAB — CBC
HEMATOCRIT: 34.9 % — AB (ref 36.0–46.0)
HEMOGLOBIN: 11.6 g/dL — AB (ref 12.0–15.0)
MCH: 30 pg (ref 26.0–34.0)
MCHC: 33.2 g/dL (ref 30.0–36.0)
MCV: 90.2 fL (ref 78.0–100.0)
Platelets: 174 10*3/uL (ref 150–400)
RBC: 3.87 MIL/uL (ref 3.87–5.11)
RDW: 14.6 % (ref 11.5–15.5)
WBC: 7.7 10*3/uL (ref 4.0–10.5)

## 2018-01-04 MED ORDER — IBUPROFEN 600 MG PO TABS: 600.0000 mg | ORAL_TABLET | Freq: Four times a day (QID) | ORAL | 0 refills | Status: AC

## 2018-01-04 MED ORDER — LABETALOL HCL 100 MG PO TABS: 100.0000 mg | ORAL_TABLET | Freq: Two times a day (BID) | ORAL | 1 refills | Status: AC

## 2018-01-04 NOTE — Discharge Summary (Signed)
OB Discharge Summary     Patient Name: Diamond Dunn DOB: August 17, 1981 MRN: [id number]  Date of admission: 01/02/2018 Delivering MD: Gerrit Heck   Date of discharge: 01/04/2018  Admitting diagnosis: 38 wk induction Intrauterine pregnancy: [redacted]w[redacted]d     Secondary diagnosis:  Principal Problem:   SVD (spontaneous vaginal delivery) Active Problems:   Maternal hypertension in third trimester   Second-degree perineal laceration, with delivery  Additional problems:None     Discharge diagnosis: Gestational Hypertension                                                                                                Post partum procedures:None  Augmentation: Pitocin and Cytotec  Complications: None and Hemorrhage>106mL  Hospital course:  Induction of Labor With Vaginal Delivery   37 y.o. yo [id number] at [redacted]w[redacted]d was admitted to the hospital 01/02/2018 for induction of labor.  Indication for induction: Gestational hypertension.  Patient had an uncomplicated labor course as follows: Membrane Rupture Time/Date:   ,    Intrapartum Procedures: Episiotomy: None [1]                                         Lacerations:  2nd degree [3];Perineal [11]  Patient had delivery of a Viable infant.  Information for the patient's newborn:  Kaitlin, Alcindor [[id number]]  Delivery Method: Vaginal, Spontaneous(Filed from Delivery Summary)   01/02/2018  Details of delivery can be found in separate delivery note.  Patient had a routine postpartum course. Patient is discharged home 01/04/18.  Physical exam  Vitals:   01/03/18 2048 01/03/18 2222 01/03/18 2325 01/04/18 0446  BP: (!) 194/111 140/76 140/84 (!) 139/95  Pulse:  96 92 72  Resp: 18   16  Temp:    97.8 F (36.6 C)  TempSrc:    Oral  SpO2:      Weight:      Height:       General: alert, cooperative and no distress Lochia: appropriate Uterine Fundus: firm Incision: N/A DVT Evaluation: No evidence of DVT seen on physical exam. Negative Homan's  sign. Labs: Lab Results  Component Value Date   WBC 9.5 01/02/2018   HGB 12.5 01/02/2018   HCT 37.3 01/02/2018   MCV 89.2 01/02/2018   PLT 194 01/02/2018   CMP Latest Ref Rng & Units 01/02/2018  Glucose 65 - 99 mg/dL 91  BUN 6 - 20 mg/dL 8  Creatinine 3.24 - 4.01 mg/dL 0.27  Sodium 253 - 664 mmol/L 135  Potassium 3.5 - 5.1 mmol/L 4.7  Chloride 101 - 111 mmol/L 104  CO2 22 - 32 mmol/L 20(L)  Calcium 8.9 - 10.3 mg/dL 9.6  Total Protein 6.5 - 8.1 g/dL 6.4(L)  Total Bilirubin 0.3 - 1.2 mg/dL 0.7  Alkaline Phos 38 - 126 U/L 111  AST 15 - 41 U/L 32  ALT 14 - 54 U/L 11(L)    Discharge instruction: per After Visit Summary and "Baby and Me Booklet".  After visit meds:  Allergies as  of 01/04/2018      Reactions   Miconazole Rash   Pt got a rash after using but not sure if the rash was caused by monistat      Medication List    TAKE these medications   calcium carbonate 500 MG chewable tablet Commonly known as:  TUMS - dosed in mg elemental calcium Chew 1-2 tablets by mouth daily as needed for indigestion or heartburn.   ibuprofen 600 MG tablet Commonly known as:  ADVIL,MOTRIN Take 1 tablet (600 mg total) by mouth every 6 (six) hours.   labetalol 100 MG tablet Commonly known as:  NORMODYNE Take 1 tablet (100 mg total) by mouth 2 (two) times daily. What changed:    medication strength  how much to take   prenatal multivitamin Tabs tablet Take 1 tablet by mouth daily.       Diet: routine diet  Activity: Advance as tolerated. Pelvic rest for 6 weeks.   Outpatient follow up:6 weeks Follow up Appt:No future appointments. Follow up Visit:No Follow-up on file.  Postpartum contraception: Vasectomy  Newborn Data: Live born female  Birth Weight: 7 lb 9 oz (3430 g) APGAR: 6, 4  Newborn Delivery   Birth date/time:  01/02/2018 21:13:00 Delivery type:  Vaginal, Spontaneous     Baby Feeding: Breast Disposition:home with mother   01/04/2018 Kenney HousemanNancy Jean Damoni Causby,  CNM

## 2018-01-04 NOTE — Discharge Instructions (Signed)
Vaginal Delivery, Care After °Refer to this sheet in the next few weeks. These instructions provide you with information about caring for yourself after vaginal delivery. Your health care provider may also give you more specific instructions. Your treatment has been planned according to current medical practices, but problems sometimes occur. Call your health care provider if you have any problems or questions. °What can I expect after the procedure? °After vaginal delivery, it is common to have: °· Some bleeding from your vagina. °· Soreness in your abdomen, your vagina, and the area of skin between your vaginal opening and your anus (perineum). °· Pelvic cramps. °· Fatigue. ° °Follow these instructions at home: °Medicines °· Take over-the-counter and prescription medicines only as told by your health care provider. °· If you were prescribed an antibiotic medicine, take it as told by your health care provider. Do not stop taking the antibiotic until it is finished. °Driving ° °· Do not drive or operate heavy machinery while taking prescription pain medicine. °· Do not drive for 24 hours if you received a sedative. °Lifestyle °· Do not drink alcohol. This is especially important if you are breastfeeding or taking medicine to relieve pain. °· Do not use tobacco products, including cigarettes, chewing tobacco, or e-cigarettes. If you need help quitting, ask your health care provider. °Eating and drinking °· Drink at least 8 eight-ounce glasses of water every day unless you are told not to by your health care provider. If you choose to breastfeed your baby, you may need to drink more water than this. °· Eat high-fiber foods every day. These foods may help prevent or relieve constipation. High-fiber foods include: °? Whole grain cereals and breads. °? Brown rice. °? Beans. °? Fresh fruits and vegetables. °Activity °· Return to your normal activities as told by your health care provider. Ask your health care provider  what activities are safe for you. °· Rest as much as possible. Try to rest or take a nap when your baby is sleeping. °· Do not lift anything that is heavier than your baby or 10 lb (4.5 kg) until your health care provider says that it is safe. °· Talk with your health care provider about when you can engage in sexual activity. This may depend on your: °? Risk of infection. °? Rate of healing. °? Comfort and desire to engage in sexual activity. °Vaginal Care °· If you have an episiotomy or a vaginal tear, check the area every day for signs of infection. Check for: °? More redness, swelling, or pain. °? More fluid or blood. °? Warmth. °? Pus or a bad smell. °· Do not use tampons or douches until your health care provider says this is safe. °· Watch for any blood clots that may pass from your vagina. These may look like clumps of dark red, brown, or black discharge. °General instructions °· Keep your perineum clean and dry as told by your health care provider. °· Wear loose, comfortable clothing. °· Wipe from front to back when you use the toilet. °· Ask your health care provider if you can shower or take a bath. If you had an episiotomy or a perineal tear during labor and delivery, your health care provider may tell you not to take baths for a certain length of time. °· Wear a bra that supports your breasts and fits you well. °· If possible, have someone help you with household activities and help care for your baby for at least a few days after   you leave the hospital. °· Keep all follow-up visits for you and your baby as told by your health care provider. This is important. °Contact a health care provider if: °· You have: °? Vaginal discharge that has a bad smell. °? Difficulty urinating. °? Pain when urinating. °? A sudden increase or decrease in the frequency of your bowel movements. °? More redness, swelling, or pain around your episiotomy or vaginal tear. °? More fluid or blood coming from your episiotomy or  vaginal tear. °? Pus or a bad smell coming from your episiotomy or vaginal tear. °? A fever. °? A rash. °? Little or no interest in activities you used to enjoy. °? Questions about caring for yourself or your baby. °· Your episiotomy or vaginal tear feels warm to the touch. °· Your episiotomy or vaginal tear is separating or does not appear to be healing. °· Your breasts are painful, hard, or turn red. °· You feel unusually sad or worried. °· You feel nauseous or you vomit. °· You pass large blood clots from your vagina. If you pass a blood clot from your vagina, save it to show to your health care provider. Do not flush blood clots down the toilet without having your health care provider look at them. °· You urinate more than usual. °· You are dizzy or light-headed. °· You have not breastfed at all and you have not had a menstrual period for 12 weeks after delivery. °· You have stopped breastfeeding and you have not had a menstrual period for 12 weeks after you stopped breastfeeding. °Get help right away if: °· You have: °? Pain that does not go away or does not get better with medicine. °? Chest pain. °? Difficulty breathing. °? Blurred vision or spots in your vision. °? Thoughts about hurting yourself or your baby. °· You develop pain in your abdomen or in one of your legs. °· You develop a severe headache. °· You faint. °· You bleed from your vagina so much that you fill two sanitary pads in one hour. °This information is not intended to replace advice given to you by your health care provider. Make sure you discuss any questions you have with your health care provider. °Document Released: 12/11/2000 Document Revised: 05/27/2016 Document Reviewed: 12/29/2015 °Elsevier Interactive Patient Education © 2018 [employer] ° °

## 2018-01-05 ENCOUNTER — Ambulatory Visit: Payer: Self-pay

## 2018-01-05 NOTE — Lactation Note (Signed)
This note was copied from a baby's chart. Lactation Consultation Note  Patient Name: Diamond Dunn Date: 01/05/2018 Reason for consult: Follow-up assessment;Early term 37-38.6wks   Follow up with mom of 5963 hour old infant. Infant with 7 BF for 15-45 minutes, 1 BF attempts, EBM x 3 of 5-15 cc, 6 voids and 2 stool in the last 24 hours. Infant weight 7 lb 3.3 oz with weight loss of 5% since birth. LATCH scores 7-9.   Mom was tearful when LC entered room. [facility]   Mom wanted to BF infant before his transfer. Mom latched infant and fed him on the right breast in the cross cradle hold with good head and pillow support. Infant was on and off the breast and was noted to be nasally stuffy throughout visit. Mom relatched infant as needed. Mom 's nipple was noted to be compressed some after feeding. Mom reports she uses her NS with some feedings and not with others.   Mom's milk is in. She was able to pump 4 oz this morning with a manual pump. Mom reports feeling like she needs to pump post BF, enc her to post pump for comfort as needed. Engorgement treatment reviewed.   Offered mom DEBP before leaving to empty breasts, she reports she will just use the manual pump for now. Mom has a Medela PIS at home for use. [facility]   Mom has bottles, labels and a cooler to send her milk with infant. Mom has 2 NS to take with her .   Mom reports she has no further questions/concerns at this time.    Maternal Data Formula Feeding for Exclusion: No Has patient been taught Hand Expression?: Yes Does the patient have breastfeeding experience prior to this delivery?: Yes  Feeding Feeding Type: Breast Fed Length of feed: 10 min  LATCH Score Latch: Repeated attempts needed to sustain latch, nipple held in mouth throughout feeding, stimulation needed to elicit sucking reflex.  Audible Swallowing: Spontaneous and  intermittent  Type of Nipple: Everted at rest and after stimulation  Comfort (Breast/Nipple): Soft / non-tender  Hold (Positioning): No assistance needed to correctly position infant at breast.  LATCH Score: 9  Interventions Interventions: Breast feeding basics reviewed;Support pillows;Skin to skin;Hand pump  Lactation Tools Discussed/Used Tools: Shells;Pump;Nipple Shields Nipple shield size: 24 Shell Type: Inverted Breast pump type: Manual WIC Program: No Pump Review: Setup, frequency, and cleaning;Milk Storage Initiated by:: Reviewed and encouraged every 2-3 hours if infant not BF   Consult Status Consult Status: Complete Follow-up type: Call as needed    Ed BlalockSharon S Veeda Virgo 01/05/2018, 12:35 PM
